# Patient Record
Sex: Female | Born: 1960 | Race: White | Hispanic: No | Marital: Married
Health system: Southern US, Community
[De-identification: ages and names within clinical notes are randomized; demographics above are authoritative.]

## PROBLEM LIST (undated history)

## (undated) DIAGNOSIS — J45909 Unspecified asthma, uncomplicated: Secondary | ICD-10-CM

---

## 2016-05-21 ENCOUNTER — Emergency Department (HOSPITAL_COMMUNITY): Payer: BLUE CROSS/BLUE SHIELD

## 2016-05-21 ENCOUNTER — Inpatient Hospital Stay (HOSPITAL_COMMUNITY)
Admission: EM | Admit: 2016-05-21 | Discharge: 2016-06-12 | DRG: 871 | Disposition: E | Payer: BLUE CROSS/BLUE SHIELD | Attending: Pulmonary Disease | Admitting: Pulmonary Disease

## 2016-05-21 ENCOUNTER — Inpatient Hospital Stay (HOSPITAL_COMMUNITY): Payer: BLUE CROSS/BLUE SHIELD

## 2016-05-21 ENCOUNTER — Encounter (HOSPITAL_COMMUNITY): Payer: Self-pay | Admitting: *Deleted

## 2016-05-21 DIAGNOSIS — R680 Hypothermia, not associated with low environmental temperature: Secondary | ICD-10-CM | POA: Diagnosis present

## 2016-05-21 DIAGNOSIS — F17210 Nicotine dependence, cigarettes, uncomplicated: Secondary | ICD-10-CM | POA: Diagnosis present

## 2016-05-21 DIAGNOSIS — R402332 Coma scale, best motor response, abnormal, at arrival to emergency department: Secondary | ICD-10-CM | POA: Diagnosis present

## 2016-05-21 DIAGNOSIS — R739 Hyperglycemia, unspecified: Secondary | ICD-10-CM | POA: Diagnosis present

## 2016-05-21 DIAGNOSIS — E8770 Fluid overload, unspecified: Secondary | ICD-10-CM | POA: Diagnosis present

## 2016-05-21 DIAGNOSIS — R402112 Coma scale, eyes open, never, at arrival to emergency department: Secondary | ICD-10-CM | POA: Diagnosis present

## 2016-05-21 DIAGNOSIS — Z01818 Encounter for other preprocedural examination: Secondary | ICD-10-CM

## 2016-05-21 DIAGNOSIS — G253 Myoclonus: Secondary | ICD-10-CM | POA: Diagnosis present

## 2016-05-21 DIAGNOSIS — G931 Anoxic brain damage, not elsewhere classified: Secondary | ICD-10-CM

## 2016-05-21 DIAGNOSIS — N179 Acute kidney failure, unspecified: Secondary | ICD-10-CM | POA: Diagnosis present

## 2016-05-21 DIAGNOSIS — E872 Acidosis, unspecified: Secondary | ICD-10-CM

## 2016-05-21 DIAGNOSIS — J9601 Acute respiratory failure with hypoxia: Secondary | ICD-10-CM | POA: Diagnosis present

## 2016-05-21 DIAGNOSIS — I248 Other forms of acute ischemic heart disease: Secondary | ICD-10-CM | POA: Diagnosis present

## 2016-05-21 DIAGNOSIS — Z9911 Dependence on respirator [ventilator] status: Secondary | ICD-10-CM

## 2016-05-21 DIAGNOSIS — I4901 Ventricular fibrillation: Secondary | ICD-10-CM | POA: Diagnosis present

## 2016-05-21 DIAGNOSIS — R6521 Severe sepsis with septic shock: Secondary | ICD-10-CM | POA: Diagnosis present

## 2016-05-21 DIAGNOSIS — J45909 Unspecified asthma, uncomplicated: Secondary | ICD-10-CM | POA: Diagnosis present

## 2016-05-21 DIAGNOSIS — J9600 Acute respiratory failure, unspecified whether with hypoxia or hypercapnia: Secondary | ICD-10-CM

## 2016-05-21 DIAGNOSIS — Z6827 Body mass index (BMI) 27.0-27.9, adult: Secondary | ICD-10-CM

## 2016-05-21 DIAGNOSIS — R402212 Coma scale, best verbal response, none, at arrival to emergency department: Secondary | ICD-10-CM | POA: Diagnosis present

## 2016-05-21 DIAGNOSIS — A419 Sepsis, unspecified organism: Principal | ICD-10-CM | POA: Diagnosis present

## 2016-05-21 DIAGNOSIS — I469 Cardiac arrest, cause unspecified: Secondary | ICD-10-CM | POA: Diagnosis present

## 2016-05-21 DIAGNOSIS — G9341 Metabolic encephalopathy: Secondary | ICD-10-CM | POA: Diagnosis present

## 2016-05-21 DIAGNOSIS — J189 Pneumonia, unspecified organism: Secondary | ICD-10-CM | POA: Diagnosis not present

## 2016-05-21 DIAGNOSIS — E669 Obesity, unspecified: Secondary | ICD-10-CM | POA: Diagnosis present

## 2016-05-21 DIAGNOSIS — Z66 Do not resuscitate: Secondary | ICD-10-CM

## 2016-05-21 DIAGNOSIS — J9602 Acute respiratory failure with hypercapnia: Secondary | ICD-10-CM | POA: Diagnosis not present

## 2016-05-21 DIAGNOSIS — Z452 Encounter for adjustment and management of vascular access device: Secondary | ICD-10-CM

## 2016-05-21 DIAGNOSIS — J690 Pneumonitis due to inhalation of food and vomit: Secondary | ICD-10-CM | POA: Diagnosis present

## 2016-05-21 HISTORY — DX: Unspecified asthma, uncomplicated: J45.909

## 2016-05-21 LAB — I-STAT CHEM 8, ED
BUN: 20 mg/dL (ref 6–20)
CHLORIDE: 106 mmol/L (ref 101–111)
Calcium, Ion: 1.08 mmol/L — ABNORMAL LOW (ref 1.15–1.40)
Creatinine, Ser: 1.1 mg/dL — ABNORMAL HIGH (ref 0.44–1.00)
GLUCOSE: 374 mg/dL — AB (ref 65–99)
HEMATOCRIT: 50 % — AB (ref 36.0–46.0)
HEMOGLOBIN: 17 g/dL — AB (ref 12.0–15.0)
POTASSIUM: 4.4 mmol/L (ref 3.5–5.1)
SODIUM: 137 mmol/L (ref 135–145)
TCO2: 17 mmol/L (ref 0–100)

## 2016-05-21 LAB — MAGNESIUM: Magnesium: 1.6 mg/dL — ABNORMAL LOW (ref 1.7–2.4)

## 2016-05-21 LAB — URINALYSIS, ROUTINE W REFLEX MICROSCOPIC
BILIRUBIN URINE: NEGATIVE
Ketones, ur: NEGATIVE mg/dL
Leukocytes, UA: NEGATIVE
NITRITE: NEGATIVE
Protein, ur: >=300 mg/dL — AB
SPECIFIC GRAVITY, URINE: 1.009 (ref 1.005–1.030)
pH: 6 (ref 5.0–8.0)

## 2016-05-21 LAB — BLOOD GAS, ARTERIAL
ACID-BASE DEFICIT: 7.7 mmol/L — AB (ref 0.0–2.0)
Acid-base deficit: 6.9 mmol/L — ABNORMAL HIGH (ref 0.0–2.0)
Bicarbonate: 16.2 mmol/L — ABNORMAL LOW (ref 20.0–28.0)
Bicarbonate: 17.3 mmol/L — ABNORMAL LOW (ref 20.0–28.0)
FIO2: 100
FIO2: 100
O2 SAT: 87.9 %
O2 Saturation: 88.4 %
PEEP/CPAP: 10 cmH2O
PEEP: 10 cmH2O
Patient temperature: 34
RATE: 20 resp/min
RATE: 30 resp/min
VT: 450 mL
VT: 450 mL
pCO2 arterial: 70.8 mmHg (ref 32.0–48.0)
pCO2 arterial: 62.5 mmHg — ABNORMAL HIGH (ref 32.0–48.0)
pH, Arterial: 7.093 — CL (ref 7.350–7.450)
pH, Arterial: 7.146 — CL (ref 7.350–7.450)
pO2, Arterial: 77.1 mmHg — ABNORMAL LOW (ref 83.0–108.0)
pO2, Arterial: 72.5 mmHg — ABNORMAL LOW (ref 83.0–108.0)

## 2016-05-21 LAB — COMPREHENSIVE METABOLIC PANEL
ALBUMIN: 2.9 g/dL — AB (ref 3.5–5.0)
ALT: 85 U/L — AB (ref 14–54)
ALT: 123 U/L — ABNORMAL HIGH (ref 14–54)
ANION GAP: 13 (ref 5–15)
AST: 184 U/L — AB (ref 15–41)
AST: 142 U/L — ABNORMAL HIGH (ref 15–41)
Albumin: 3.5 g/dL (ref 3.5–5.0)
Alkaline Phosphatase: 126 U/L (ref 38–126)
Alkaline Phosphatase: 75 U/L (ref 38–126)
Anion gap: 18 — ABNORMAL HIGH (ref 5–15)
BILIRUBIN TOTAL: 1 mg/dL (ref 0.3–1.2)
BUN: 18 mg/dL (ref 6–20)
BUN: 16 mg/dL (ref 6–20)
CHLORIDE: 103 mmol/L (ref 101–111)
CO2: 17 mmol/L — AB (ref 22–32)
CO2: 21 mmol/L — AB (ref 22–32)
CREATININE: 1.38 mg/dL — AB (ref 0.44–1.00)
Calcium: 8.7 mg/dL — ABNORMAL LOW (ref 8.9–10.3)
Calcium: 7.1 mg/dL — ABNORMAL LOW (ref 8.9–10.3)
Chloride: 105 mmol/L (ref 101–111)
Creatinine, Ser: 1.02 mg/dL — ABNORMAL HIGH (ref 0.44–1.00)
GFR calc non Af Amer: 41 mL/min — ABNORMAL LOW (ref >60)
GFR calc non Af Amer: 59 mL/min — ABNORMAL LOW (ref >60)
GFR, EST AFRICAN AMERICAN: 48 mL/min — AB (ref >60)
GLUCOSE: 199 mg/dL — AB (ref 65–99)
Glucose, Bld: 373 mg/dL — ABNORMAL HIGH (ref 65–99)
POTASSIUM: 2.9 mmol/L — AB (ref 3.5–5.1)
Potassium: 4.5 mmol/L (ref 3.5–5.1)
SODIUM: 138 mmol/L (ref 135–145)
SODIUM: 139 mmol/L (ref 135–145)
TOTAL PROTEIN: 7.4 g/dL (ref 6.5–8.1)
TOTAL PROTEIN: 5.5 g/dL — AB (ref 6.5–8.1)
Total Bilirubin: 0.6 mg/dL (ref 0.3–1.2)

## 2016-05-21 LAB — CBC WITH DIFFERENTIAL/PLATELET
BASOS PCT: 1 %
Band Neutrophils: 3 %
Basophils Absolute: 0.6 10*3/uL — ABNORMAL HIGH (ref 0.0–0.1)
EOS PCT: 3 %
Eosinophils Absolute: 1.7 10*3/uL — ABNORMAL HIGH (ref 0.0–0.7)
HEMATOCRIT: 47.3 % — AB (ref 36.0–46.0)
HEMOGLOBIN: 14.9 g/dL (ref 12.0–15.0)
Lymphocytes Relative: 15 %
Lymphs Abs: 8.6 10*3/uL — ABNORMAL HIGH (ref 0.7–4.0)
MCH: 29.4 pg (ref 26.0–34.0)
MCHC: 31.5 g/dL (ref 30.0–36.0)
MCV: 93.5 fL (ref 78.0–100.0)
METAMYELOCYTES PCT: 7 %
MONO ABS: 1.1 10*3/uL — AB (ref 0.1–1.0)
MYELOCYTES: 5 %
Monocytes Relative: 2 %
NEUTROS PCT: 64 %
Neutro Abs: 45.2 10*3/uL — ABNORMAL HIGH (ref 1.7–7.7)
Platelets: 403 10*3/uL — ABNORMAL HIGH (ref 150–400)
RBC: 5.06 MIL/uL (ref 3.87–5.11)
RDW: 14.3 % (ref 11.5–15.5)
WBC: 57.2 10*3/uL (ref 4.0–10.5)

## 2016-05-21 LAB — RESPIRATORY PANEL BY PCR
ADENOVIRUS-RVPPCR: NOT DETECTED
BORDETELLA PERTUSSIS-RVPCR: NOT DETECTED
CHLAMYDOPHILA PNEUMONIAE-RVPPCR: NOT DETECTED
CORONAVIRUS HKU1-RVPPCR: NOT DETECTED
CORONAVIRUS NL63-RVPPCR: NOT DETECTED
Coronavirus 229E: NOT DETECTED
Coronavirus OC43: NOT DETECTED
Influenza A: NOT DETECTED
Influenza B: NOT DETECTED
MYCOPLASMA PNEUMONIAE-RVPPCR: NOT DETECTED
Metapneumovirus: NOT DETECTED
PARAINFLUENZA VIRUS 4-RVPPCR: NOT DETECTED
Parainfluenza Virus 1: NOT DETECTED
Parainfluenza Virus 2: NOT DETECTED
Parainfluenza Virus 3: NOT DETECTED
RESPIRATORY SYNCYTIAL VIRUS-RVPPCR: NOT DETECTED
RHINOVIRUS / ENTEROVIRUS - RVPPCR: NOT DETECTED

## 2016-05-21 LAB — POCT I-STAT 3, ART BLOOD GAS (G3+)
ACID-BASE DEFICIT: 8 mmol/L — AB (ref 0.0–2.0)
Bicarbonate: 20.5 mmol/L (ref 20.0–28.0)
O2 SAT: 95 %
PCO2 ART: 50 mmHg — AB (ref 32.0–48.0)
PH ART: 7.211 — AB (ref 7.350–7.450)
Patient temperature: 35.2
TCO2: 22 mmol/L (ref 0–100)
pO2, Arterial: 83 mmHg (ref 83.0–108.0)

## 2016-05-21 LAB — PROCALCITONIN: Procalcitonin: 6.71 ng/mL

## 2016-05-21 LAB — GLUCOSE, CAPILLARY
GLUCOSE-CAPILLARY: 214 mg/dL — AB (ref 65–99)
GLUCOSE-CAPILLARY: 151 mg/dL — AB (ref 65–99)
GLUCOSE-CAPILLARY: 152 mg/dL — AB (ref 65–99)
Glucose-Capillary: 172 mg/dL — ABNORMAL HIGH (ref 65–99)

## 2016-05-21 LAB — TYPE AND SCREEN
ABO/RH(D): O POS
ABO/RH(D): O POS
ANTIBODY SCREEN: NEGATIVE
Antibody Screen: NEGATIVE

## 2016-05-21 LAB — MRSA PCR SCREENING: MRSA BY PCR: NEGATIVE

## 2016-05-21 LAB — I-STAT CG4 LACTIC ACID, ED: LACTIC ACID, VENOUS: 11.01 mmol/L — AB (ref 0.5–1.9)

## 2016-05-21 LAB — TROPONIN I
TROPONIN I: 0.05 ng/mL — AB (ref <0.03)
TROPONIN I: 0.05 ng/mL — AB (ref <0.03)
Troponin I: 0.05 ng/mL (ref <0.03)

## 2016-05-21 LAB — PROTIME-INR
INR: 1.2
Prothrombin Time: 15.3 seconds — ABNORMAL HIGH (ref 11.4–15.2)

## 2016-05-21 LAB — I-STAT TROPONIN, ED: Troponin i, poc: 0.03 ng/mL (ref 0.00–0.08)

## 2016-05-21 LAB — BRAIN NATRIURETIC PEPTIDE: B Natriuretic Peptide: 370 pg/mL — ABNORMAL HIGH (ref 0.0–100.0)

## 2016-05-21 LAB — LACTIC ACID, PLASMA
LACTIC ACID, VENOUS: 4 mmol/L — AB (ref 0.5–1.9)
Lactic Acid, Venous: 4 mmol/L (ref 0.5–1.9)

## 2016-05-21 LAB — LIPID PANEL
CHOLESTEROL: 131 mg/dL (ref 0–200)
HDL: 40 mg/dL — ABNORMAL LOW (ref >40)
LDL Cholesterol: 71 mg/dL (ref 0–99)
TRIGLYCERIDES: 101 mg/dL (ref <150)
Total CHOL/HDL Ratio: 3.3 RATIO
VLDL: 20 mg/dL (ref 0–40)

## 2016-05-21 LAB — STREP PNEUMONIAE URINARY ANTIGEN: Strep Pneumo Urinary Antigen: NEGATIVE

## 2016-05-21 LAB — ABO/RH: ABO/RH(D): O POS

## 2016-05-21 LAB — ETHANOL

## 2016-05-21 LAB — TRIGLYCERIDES: Triglycerides: 96 mg/dL (ref <150)

## 2016-05-21 LAB — PHOSPHORUS: PHOSPHORUS: 1.9 mg/dL — AB (ref 2.5–4.6)

## 2016-05-21 MED ORDER — ROCURONIUM BROMIDE 50 MG/5ML IV SOLN
90.0000 mg | Freq: Once | INTRAVENOUS | Status: AC
  Administered 2016-05-21: 90 mg via INTRAVENOUS

## 2016-05-21 MED ORDER — ALBUTEROL SULFATE (2.5 MG/3ML) 0.083% IN NEBU
INHALATION_SOLUTION | RESPIRATORY_TRACT | Status: AC
Start: 2016-05-21 — End: 2016-05-21
  Administered 2016-05-21: 5 mg
  Filled 2016-05-21: qty 6

## 2016-05-21 MED ORDER — SODIUM CHLORIDE 0.9 % IV BOLUS (SEPSIS)
1000.0000 mL | Freq: Once | INTRAVENOUS | Status: AC
  Administered 2016-05-21: 1000 mL via INTRAVENOUS

## 2016-05-21 MED ORDER — IOPAMIDOL (ISOVUE-370) INJECTION 76%
80.0000 mL | Freq: Once | INTRAVENOUS | Status: AC | PRN
  Administered 2016-05-21: 80 mL via INTRAVENOUS

## 2016-05-21 MED ORDER — LEVETIRACETAM 500 MG/5ML IV SOLN
500.0000 mg | Freq: Two times a day (BID) | INTRAVENOUS | Status: DC
  Administered 2016-05-21 – 2016-05-22 (×2): 500 mg via INTRAVENOUS
  Filled 2016-05-21 (×4): qty 5

## 2016-05-21 MED ORDER — ORAL CARE MOUTH RINSE
15.0000 mL | OROMUCOSAL | Status: DC
  Administered 2016-05-21 – 2016-05-22 (×5): 15 mL via OROMUCOSAL

## 2016-05-21 MED ORDER — ETOMIDATE 2 MG/ML IV SOLN
20.0000 mg | Freq: Once | INTRAVENOUS | Status: AC
  Administered 2016-05-21: 20 mg via INTRAVENOUS

## 2016-05-21 MED ORDER — DEXTROSE 5 % IV SOLN
0.0000 ug/min | INTRAVENOUS | Status: DC
  Administered 2016-05-21 (×2): 20 ug/min via INTRAVENOUS
  Administered 2016-05-21: 10 ug/min via INTRAVENOUS
  Administered 2016-05-22: 18 ug/min via INTRAVENOUS
  Filled 2016-05-21 (×4): qty 4

## 2016-05-21 MED ORDER — VANCOMYCIN HCL 10 G IV SOLR
1500.0000 mg | Freq: Once | INTRAVENOUS | Status: AC
  Administered 2016-05-21: 1500 mg via INTRAVENOUS
  Filled 2016-05-21: qty 1500

## 2016-05-21 MED ORDER — POTASSIUM PHOSPHATES 15 MMOLE/5ML IV SOLN
20.0000 mmol | Freq: Once | INTRAVENOUS | Status: AC
  Administered 2016-05-21: 20 mmol via INTRAVENOUS
  Filled 2016-05-21: qty 6.67

## 2016-05-21 MED ORDER — ACETAMINOPHEN 160 MG/5ML PO SOLN
650.0000 mg | Freq: Four times a day (QID) | ORAL | Status: DC | PRN
  Administered 2016-05-22 (×2): 650 mg via ORAL
  Filled 2016-05-21 (×2): qty 20.3

## 2016-05-21 MED ORDER — HEPARIN SODIUM (PORCINE) 5000 UNIT/ML IJ SOLN
5000.0000 [IU] | Freq: Three times a day (TID) | INTRAMUSCULAR | Status: DC
  Administered 2016-05-21 (×2): 5000 [IU] via SUBCUTANEOUS
  Filled 2016-05-21 (×2): qty 1

## 2016-05-21 MED ORDER — POTASSIUM CHLORIDE 10 MEQ/50ML IV SOLN
10.0000 meq | INTRAVENOUS | Status: AC
  Administered 2016-05-21 – 2016-05-22 (×4): 10 meq via INTRAVENOUS
  Filled 2016-05-21 (×4): qty 50

## 2016-05-21 MED ORDER — SODIUM CHLORIDE 0.9% FLUSH
10.0000 mL | Freq: Two times a day (BID) | INTRAVENOUS | Status: DC
  Administered 2016-05-21 (×2): 10 mL

## 2016-05-21 MED ORDER — FENTANYL CITRATE (PF) 100 MCG/2ML IJ SOLN
25.0000 ug | INTRAMUSCULAR | Status: DC | PRN
  Administered 2016-05-21: 100 ug via INTRAVENOUS
  Administered 2016-05-21: 50 ug via INTRAVENOUS
  Filled 2016-05-21 (×2): qty 2

## 2016-05-21 MED ORDER — SODIUM CHLORIDE 0.9% FLUSH
10.0000 mL | Freq: Two times a day (BID) | INTRAVENOUS | Status: DC
  Administered 2016-05-21: 10 mL

## 2016-05-21 MED ORDER — PIPERACILLIN-TAZOBACTAM 3.375 G IVPB
3.3750 g | Freq: Three times a day (TID) | INTRAVENOUS | Status: DC
  Administered 2016-05-21 – 2016-05-22 (×2): 3.375 g via INTRAVENOUS
  Filled 2016-05-21 (×6): qty 50

## 2016-05-21 MED ORDER — PANTOPRAZOLE SODIUM 40 MG IV SOLR
40.0000 mg | Freq: Every day | INTRAVENOUS | Status: DC
  Administered 2016-05-21: 40 mg via INTRAVENOUS
  Filled 2016-05-21: qty 40

## 2016-05-21 MED ORDER — ACETAMINOPHEN 160 MG/5ML PO SOLN: 325.0000 mg | Freq: Four times a day (QID) | ORAL | Status: DC | PRN

## 2016-05-21 MED ORDER — IPRATROPIUM-ALBUTEROL 0.5-2.5 (3) MG/3ML IN SOLN
3.0000 mL | Freq: Four times a day (QID) | RESPIRATORY_TRACT | Status: DC
  Administered 2016-05-21 – 2016-05-22 (×3): 3 mL via RESPIRATORY_TRACT
  Filled 2016-05-21 (×3): qty 3

## 2016-05-21 MED ORDER — MIDAZOLAM HCL 5 MG/ML IJ SOLN
0.0000 mg/h | INTRAMUSCULAR | Status: DC
  Administered 2016-05-21: 2 mg/h via INTRAVENOUS
  Filled 2016-05-21: qty 10

## 2016-05-21 MED ORDER — SODIUM CHLORIDE 0.9% FLUSH: 10.0000 mL | INTRAVENOUS | Status: DC | PRN

## 2016-05-21 MED ORDER — STERILE WATER FOR INJECTION IV SOLN
INTRAVENOUS | Status: DC
  Administered 2016-05-21 – 2016-05-22 (×3): via INTRAVENOUS
  Filled 2016-05-21 (×7): qty 850

## 2016-05-21 MED ORDER — PROPOFOL 1000 MG/100ML IV EMUL
5.0000 ug/kg/min | INTRAVENOUS | Status: DC
  Administered 2016-05-21: 10 ug/kg/min via INTRAVENOUS
  Administered 2016-05-22: 25 ug/kg/min via INTRAVENOUS
  Filled 2016-05-21 (×2): qty 100

## 2016-05-21 MED ORDER — CHLORHEXIDINE GLUCONATE 0.12% ORAL RINSE (MEDLINE KIT)
15.0000 mL | Freq: Two times a day (BID) | OROMUCOSAL | Status: DC
  Administered 2016-05-21 (×2): 15 mL via OROMUCOSAL

## 2016-05-21 MED ORDER — SODIUM CHLORIDE 0.9 % IV SOLN: INTRAVENOUS | Status: DC | PRN

## 2016-05-21 MED ORDER — DEXTROSE 5 % IV SOLN
500.0000 mg | Freq: Once | INTRAVENOUS | Status: AC
  Administered 2016-05-21: 500 mg via INTRAVENOUS
  Filled 2016-05-21: qty 500

## 2016-05-21 MED ORDER — INSULIN ASPART 100 UNIT/ML ~~LOC~~ SOLN
2.0000 [IU] | SUBCUTANEOUS | Status: DC
  Administered 2016-05-21: 4 [IU] via SUBCUTANEOUS
  Administered 2016-05-21: 6 [IU] via SUBCUTANEOUS
  Administered 2016-05-21 – 2016-05-22 (×2): 4 [IU] via SUBCUTANEOUS

## 2016-05-21 MED ORDER — MAGNESIUM SULFATE 2 GM/50ML IV SOLN
2.0000 g | Freq: Once | INTRAVENOUS | Status: AC
  Administered 2016-05-21: 2 g via INTRAVENOUS
  Filled 2016-05-21: qty 50

## 2016-05-21 MED ORDER — MIDAZOLAM BOLUS VIA INFUSION
1.0000 mg | INTRAVENOUS | Status: DC | PRN
  Filled 2016-05-21: qty 2

## 2016-05-21 MED ORDER — CHLORHEXIDINE GLUCONATE CLOTH 2 % EX PADS: 6.0000 | MEDICATED_PAD | Freq: Every day | CUTANEOUS | Status: DC

## 2016-05-21 MED ORDER — MIDAZOLAM HCL 2 MG/2ML IJ SOLN
2.0000 mg | Freq: Once | INTRAMUSCULAR | Status: DC
  Filled 2016-05-21: qty 2

## 2016-05-21 MED ORDER — DEXTROSE 5 % IV SOLN
500.0000 mg | INTRAVENOUS | Status: DC
  Filled 2016-05-21: qty 500

## 2016-05-21 MED ORDER — FENTANYL CITRATE (PF) 100 MCG/2ML IJ SOLN: 25.0000 ug | INTRAMUSCULAR | Status: DC | PRN

## 2016-05-21 MED ORDER — SODIUM CHLORIDE 0.9 % IV BOLUS (SEPSIS)
500.0000 mL | Freq: Once | INTRAVENOUS | Status: AC
  Administered 2016-05-21: 500 mL via INTRAVENOUS

## 2016-05-21 MED ORDER — VECURONIUM BROMIDE 10 MG IV SOLR
INTRAVENOUS | Status: AC
  Filled 2016-05-21: qty 10

## 2016-05-21 MED ORDER — METHYLPREDNISOLONE SODIUM SUCC 125 MG IJ SOLR
60.0000 mg | Freq: Four times a day (QID) | INTRAMUSCULAR | Status: DC
  Administered 2016-05-21 (×3): 60 mg via INTRAVENOUS
  Filled 2016-05-21 (×3): qty 2

## 2016-05-21 MED ORDER — SODIUM CHLORIDE 0.9 % IV SOLN
2.0000 g | Freq: Once | INTRAVENOUS | Status: AC
  Administered 2016-05-21: 2 g via INTRAVENOUS
  Filled 2016-05-21: qty 20

## 2016-05-21 MED ORDER — VECURONIUM BROMIDE 10 MG IV SOLR
10.0000 mg | Freq: Once | INTRAVENOUS | Status: AC
  Administered 2016-05-21: 10 mg via INTRAVENOUS

## 2016-05-21 MED ORDER — SODIUM CHLORIDE 0.9 % IV SOLN: 250.0000 mL | INTRAVENOUS | Status: DC | PRN

## 2016-05-21 MED ORDER — DEXTROSE 5 % IV SOLN
1.0000 g | Freq: Once | INTRAVENOUS | Status: AC
  Administered 2016-05-21: 1 g via INTRAVENOUS
  Filled 2016-05-21: qty 10

## 2016-05-21 MED ORDER — CHLORHEXIDINE GLUCONATE CLOTH 2 % EX PADS
6.0000 | MEDICATED_PAD | Freq: Every day | CUTANEOUS | Status: DC
  Administered 2016-05-21: 6 via TOPICAL

## 2016-05-21 MED ORDER — VANCOMYCIN HCL IN DEXTROSE 1-5 GM/200ML-% IV SOLN
1000.0000 mg | Freq: Two times a day (BID) | INTRAVENOUS | Status: DC
  Administered 2016-05-21: 1000 mg via INTRAVENOUS
  Filled 2016-05-21 (×3): qty 200

## 2016-05-21 NOTE — Progress Notes (Signed)
eLink Physician-Brief Progress Note Patient Name: Olivia BruinsLaura Willis DOB: 10/24/1960 MRN: [id number]   Date of Service  2016-08-16  HPI/Events of Note  Multiple electrolyte abnormalities.   eICU Interventions  Replaced.      Intervention Category Major Interventions: Electrolyte abnormality - evaluation and management  Shane Crutchradeep Amarii Amy 2016-08-16, 9:29 PM

## 2016-05-21 NOTE — ED Triage Notes (Signed)
Pt brought in by [facility] for c/o cpr; pt was a witnessed cpr by husband; pt had got up to go to bathroom and husband stated pt started c/o chest pain; pt became unresponsive and husband called 911, instructions to give cpr were given to husband, husband did cpr for 12 minutes until ems arrived and ems performed cpr from 5am to 550; pt was given one round of epi with return of pulses; pt was intubated using king ET and was found to have spontaneous respirations;

## 2016-05-21 NOTE — ED Notes (Signed)
CRITICAL VALUE ALERT  Critical value received:  pH 7.093, pCO2 70.8, pO2 77.1, Bicarb 16.2, SO2 87.9  Date of notification:  2016/09/06  Time of notification:  0812  Critical value read back:Yes.    Nurse who received alert:  Fransico HimMeagan Leroy Pettway, RN  MD notified (1st page):  Dr. Particia NearingHaviland  Time of first page:  0820  MD notified (2nd page): Dr. Celene SkeenNester  Time of second page: 0822  Responding MD:  Dr. Celene SkeenNester  Time MD responded:  [phone]

## 2016-05-21 NOTE — ED Provider Notes (Signed)
Pt signed out by Dr. Criss AlvineGoldston awaiting [facility].  The pt's CT scans were reviewed.  No PE.  Pt's ABG d/w Dr. Marlyne BeardsJennings who recommended bicarb drip.  This was started.  Care Link now here.  Pt is as stable as possible for transfer.  Husband updated at bedside.   Jacalyn LefevreHaviland, Deniyah Dillavou, MD 06/22/2016 [phone]

## 2016-05-21 NOTE — H&P (Signed)
PULMONARY / CRITICAL CARE MEDICINE   Name: Kristin BruinsLaura Tregoning MRN: [id number] DOB: 01/07/1961    ADMISSION DATE:  05/13/2016 CONSULTATION DATE:  05/17/2016  REFERRING MD:  Dr. Lorin PicketScott, EDP [facility]  CHIEF COMPLAINT:  Cardiac Arrest   HISTORY OF PRESENT ILLNESS:   56 year old smoker (husband reports recently patient attempting to cut back) female with PMH of Asthma. Presents to ED on 5/10 from home after witnessed cardiac arrest. Husband reports for the last week patient has been complaining of dyspnea and cough. She was seen recently at urgent care in which she was prescribed prednisone, albuterol, and possible antibiotic (family unsure).   5/10 patient was walking to bathroom when she began complaining of severe dyspnea and chest pain. Patient became unresponsive and husband called 911 and began CPR. EMS arrived 12 minutes later, and Coded patient for an additional 50 minutes for a total down time of 62 minutes. Upon arrival to ED patient was hypotensive (systolic 60-70) with lactic acid of 11.01 and WBC 57.2. Code Sepsis initiated. PE and Heat CT negative for acute process. CXR revealed diffuse disease and interstitial opacity greater to the right concerning for PNA vs Aspiration. ABG 7.093/70.8/77.1. [facility] with PCCM to admit.    PAST MEDICAL HISTORY :  She  has a past medical history of Asthma.  PAST SURGICAL HISTORY: She  has no past surgical history on file.  No Known Allergies  No current facility-administered medications on file prior to encounter.    No current outpatient prescriptions on file prior to encounter.    FAMILY HISTORY:  Her has no family status information on file.    SOCIAL HISTORY: She    REVIEW OF SYSTEMS:   Unable to review as patient is intubated and unresponsive   SUBJECTIVE:  Presented from [facility] on ventilator  VITAL SIGNS: BP 105/76   Pulse 94   Temp (!) 94.5 F (34.7 C)   Resp (!) 30   Ht 5\' 8"  (1.727 m)    Wt 81.6 kg (180 lb)   SpO2 96%   BMI 27.37 kg/m   HEMODYNAMICS:    VENTILATOR SETTINGS: Vent Mode: PRVC FiO2 (%):  [100 %] 100 % Set Rate:  [20 bmp-30 bmp] 30 bmp Vt Set:  [450 mL] 450 mL PEEP:  [10 cmH20] 10 cmH20 Plateau Pressure:  [30 cmH20] 30 cmH20  INTAKE / OUTPUT: No intake/output data recorded.  PHYSICAL EXAMINATION: General:  Adult female, no distress  Neuro:  Myoclonic seizures noted, upward gaze, pupils intact, no response to pain, -gag, -cough   HEENT:  ETT in place  Cardiovascular:  Tachy, no MRG, NI S1/S2 Lungs:  Distant/diminished breath sounds, no wheeze  Abdomen:  Obese, active bowel sounds Musculoskeletal:  No acute  Skin:  Cool, dry, intact   LABS:  BMET  Recent Labs Lab 05/17/2016 0645 06/01/2016 0654  NA 138 137  K 4.5 4.4  CL 103 106  CO2 17*  --   BUN 18 20  CREATININE 1.38* 1.10*  GLUCOSE 373* 374*    Electrolytes  Recent Labs Lab 05/23/2016 0645  CALCIUM 8.7*    CBC  Recent Labs Lab 05/12/2016 0645 05/26/2016 0654  WBC 57.2*  --   HGB 14.9 17.0*  HCT 47.3* 50.0*  PLT 403*  --     Coag's  Recent Labs Lab 05/23/2016 0645  INR 1.20    Sepsis Markers  Recent Labs Lab 06/07/2016 0649  LATICACIDVEN 11.01*    ABG  Recent Labs Lab 05/24/2016 0800 05-24-2016 0915  PHART 7.093* 7.146*  PCO2ART 70.8* 62.5*  PO2ART 77.1* 72.5*    Liver Enzymes  Recent Labs Lab May 24, 2016 0645  AST 184*  ALT 85*  ALKPHOS 126  BILITOT 0.6  ALBUMIN 3.5    Cardiac Enzymes  Recent Labs Lab May 24, 2016 0645  TROPONINI 0.05*    Glucose No results for input(s): GLUCAP in the last 168 hours.  Imaging Ct Head Wo Contrast  Result Date: 05/24/2016 CLINICAL DATA:  56 y/o female with acute chest pain and then became unresponsive. Cardiac arrest. Status post CPR. EXAM: CT HEAD WITHOUT CONTRAST TECHNIQUE: Contiguous axial images were obtained from the base of the skull through the vertex without intravenous contrast. COMPARISON:  None.  FINDINGS: Brain: No midline shift, ventriculomegaly, mass effect, evidence of mass lesion, intracranial hemorrhage or evidence of cortically based acute infarction. Gray-white matter differentiation is within normal limits throughout the brain. Small dystrophic calcifications in the caudate nuclei. No cerebral edema is evident. No cortical encephalomalacia identified. Vascular: No suspicious intracranial vascular hyperdensity. Skull: Intact.  No acute osseous abnormality identified. Sinuses/Orbits: Clear. Retained secretions in the nasal cavity and nasopharynx. Endotracheal and oral enteric tubes visible on the scout view. Other: Visualized orbits and scalp soft tissues are within normal limits. IMPRESSION: No acute intracranial abnormality is evident by CT. Electronically Signed   By: Odessa Fleming M.D.   On: 2016/05/24 08:53   Ct Angio Chest Pe W Or Wo Contrast  Result Date: 2016-05-24 CLINICAL DATA:  56 y/o female with acute chest pain and then became unresponsive. Cardiac arrest. Status post CPR. EXAM: CT ANGIOGRAPHY CHEST WITH CONTRAST TECHNIQUE: Multidetector CT imaging of the chest was performed using the standard protocol during bolus administration of intravenous contrast. Multiplanar CT image reconstructions and MIPs were obtained to evaluate the vascular anatomy. CONTRAST:  80 mL Isovue 370 COMPARISON:  Portable chest 0700 hours today. Chest radiographs 05/15/2016 FINDINGS: Cardiovascular: Good contrast bolus timing in the pulmonary arterial tree. No focal filling defect identified in the pulmonary arteries to suggest acute pulmonary embolism. Borderline to mild cardiomegaly. No pericardial effusion. Grossly negative visible aorta. No calcified coronary artery atherosclerosis is evident. Mediastinum/Nodes: Enteric tube in place, courses to the mid stomach. No mediastinal mass or lymphadenopathy. Lungs/Pleura: Endotracheal tube tip located between the clavicles and carina. Major airways are patent, but there  are widespread areas of dependent consolidation in the posterior upper lobes and right greater than left lower lobes. Superimposed mosaic attenuation elsewhere in the lungs. Mild upper lobe pulmonary septal thickening. Trace superimposed bilateral pleural effusions. Upper Abdomen: Enteric tube appears to terminated the mid stomach. Negative visible liver, spleen, pancreas, adrenal glands and left kidney. Musculoskeletal: Sternum intact. Nondisplaced anterolateral left fifth and seventh rib fractures. No other No acute osseous abnormality identified. Review of the MIP images confirms the above findings. IMPRESSION: 1. No evidence of acute pulmonary embolus. Negative visible aorta. Borderline to mild cardiomegaly without pleural effusion. 2. Fairly large areas of dependent bilateral pulmonary consolidation, most likely aspiration in this clinical setting. Superimposed small bilateral pleural effusions. Superimposed bilateral mosaic attenuation and apical septal thickening may reflect mild pulmonary edema. 3. Nondisplaced left fifth and seventh rib fractures. Electronically Signed   By: Odessa Fleming M.D.   On: May 24, 2016 09:01   Dg Chest Portable 1 View  Result Date: 05/24/2016 CLINICAL DATA:  CPR. EXAM: PORTABLE CHEST 1 VIEW COMPARISON:  05/15/2016 FINDINGS: Endotracheal tube tip is at the clavicular heads. An orogastric tube reaches the  stomach. Normal heart size. Diffuse airspace and interstitial opacity with the appearance of pulmonary edema. No visible effusion or pneumothorax. No detected rib fracture. Limited exam with overlapping hardware and underpenetration. IMPRESSION: 1. Unremarkable positioning of endotracheal and orogastric tubes. 2. Pulmonary edema. Opacification is asymmetric to the right and superimposed pneumonia or aspiration is not excluded. Electronically Signed   By: Marnee Spring M.D.   On: 05/21/2016 07:21     STUDIES:  CT Head 5/10 > No acute CTA PE 5/10 > No PE, Borderline mild  cardiomegaly without pleural effusion, large areas of dependent bilateral pulmonary consolidation, most likely aspiration, superimposed small bilateral pleural effusions, superimposed bilateral mosaic attenuation and apical septal thickening, nondisplaced left fifth and seventh rib fractures   CULTURES: Blood 5/10 >> Urine 5/10 >> Sputum 5/10 >> Step Pnemo 5/10 > Negative   ANTIBIOTICS: Cefepime 5/10 one dose in ED  Vancomycin 5/10 >> Zosyn 5/10 >> Azithromycin 5/10 >>  SIGNIFICANT EVENTS: 5/10 > Presents to ED > Cardiac Arrest   LINES/TUBES: ETT 5/10 >> RIJ 5/10 >>  DISCUSSION: 56 year old smoker with PMH of asthma presents to ED after cardiac arrest at home. Total downtime 62 minutes. CXR with CAP vs Aspiration.   ASSESSMENT / PLAN:  PULMONARY A: Acute Hypoxic/Hypercarbia Respiratory Failure +/-Aspiration, +/-CAP, +/-Volume Overload, +/-Viral Infection  Respiratory Acidosis  H/O Asthma  P:   Vent Support No wean in setting of Encephalopathy/AMS  Trend CXR/ABG  VAP Bundle  Solu-Medrol 60mg  q6h  CARDIOVASCULAR A:  Cardiac Arrest  -Total down time 62 minutes with PEA/Asystole with hypothermia on arrival > decision made note to activate code cool Cardiogenic vs Septic Shock  Elevated Trop > Demand Ischemia ? P:  Cardiac Monitoring  Trend Trop Levophed to maintain MAP >65  Place Aline/CVC  Maintain TEMP <99   RENAL A:   Lactic Acidosis  S/P 4L NS Acute Kidney Injury  P:   Trend Lactic Acid  Trend BMP Replace Electrolytes as needed  Bicarb 150 meq @ 150 ml/hr   GASTROINTESTINAL A:   Nutrition  Obesity P:   NPO PPI  HEMATOLOGIC A:   DVT prophylaxis    P:  Trend CBC Smear pending  Heparin SQ   INFECTIOUS A:   Septic Shock in setting of CAP +/- Viral infection vs Aspiration  Leukocytosis (WBC 57.2 on arrival) P:   Follow Culture Data Trend WBC and Fever Curve  Trend Procal and Lactic Acid  RVP Pending  Vancomycin/Zosyn/Azithromycin    ENDOCRINE A:   Hyperglycemia    P:   Trend Glucose  SSI HA1C in AM   NEUROLOGIC A:   Acute Encephalopathy metabolic vs Anoxic  Myoclonic Seizure  P:   RASS goal: 0/-1 EEG pending  Keppra 500 mg BID  Fentanyl PRN    FAMILY  - Updates: No family at bedside   - Inter-disciplinary family meet or Palliative Care meeting due by:  05/28/2016   CC Time: 62 minutes   Jovita Kussmaul, AGAC-NP [facility]  Pgr: [phone]  PCCM Pgr: [phone]

## 2016-05-21 NOTE — ED Notes (Signed)
CRITICAL VALUE ALERT  Critical value received: Troponin 0.05  Date of notification:  05/26/2016  Time of notification:  0722  Critical value read back: yes  Nurse who received alert: Tsosie Billingobin Hisae Decoursey RN  MD notified (1st page):  Dr Criss AlvineGoldston

## 2016-05-21 NOTE — Procedures (Signed)
Arterial Catheter Insertion Procedure Note Kristin BruinsLaura Hollick [id number] 10/16/1960  Procedure: Insertion of Arterial Catheter  Indications: Blood pressure monitoring  Procedure Details Consent: Risks of procedure as well as the alternatives and risks of each were explained to the (patient/caregiver).  Consent for procedure obtained. Time Out: Verified patient identification, verified procedure, site/side was marked, verified correct patient position, special equipment/implants available, medications/allergies/relevent history reviewed, required imaging and test results available.  Performed  Maximum sterile technique was used including antiseptics. Skin prep: Chlorhexidine; local anesthetic administered 20 gauge catheter was inserted into right radial artery using the Seldinger technique.  Evaluation Blood flow good; BP tracing good. Complications: No apparent complications.   Ronny FlurryMorgan B Callyn Severtson January 06, 2017

## 2016-05-21 NOTE — ED Notes (Addendum)
Date and time results received: 06/11/2016 9:26 AM  (use smartphrase ".now" to insert current time)  Test: ABG Critical Value:  PH     7.146 CO2   62.5 Po2    72.5   Name of Provider Notified: Jamison NeighborNestor  Orders Received? Or Actions Taken?: Actions Taken: Verbal to increase respirations to 32 and [facility]

## 2016-05-21 NOTE — ED Notes (Signed)
Date and time results received: May 13, 2016 0706 (use smartphrase ".now" to insert current time)  Test: Lactic Critical Value: 11.0 Name of Provider Notified: Dr Rayford HalstedGolston  Orders Received? Or Actions Taken?:  No actions taken

## 2016-05-21 NOTE — Procedures (Signed)
Central Venous Catheter Insertion Procedure Note Olivia BruinsLaura Willis [id number] 04/02/1960  Procedure: Insertion of Central Venous Catheter Indications: Assessment of intravascular volume, Drug and/or fluid administration and Frequent blood sampling  Procedure Details Consent: Unable to obtain consent because of emergent medical necessity. Time Out: Verified patient identification, verified procedure, site/side was marked, verified correct patient position, special equipment/implants available, medications/allergies/relevent history reviewed, required imaging and test results available.  Performed  Maximum sterile technique was used including antiseptics, cap, gloves, gown, hand hygiene, mask and sheet. Skin prep: Chlorhexidine; local anesthetic administered A antimicrobial bonded/coated triple lumen catheter was placed in the right internal jugular vein using the Seldinger technique.  Evaluation Blood flow good Complications: No apparent complications Patient did tolerate procedure well. Chest X-ray ordered to verify placement.  CXR: pending.  Olivia KussmaulKatalina Willis, AGAC-NP [place] [facility]  Pgr: [phone]  PCCM Pgr: [phone]

## 2016-05-21 NOTE — Progress Notes (Signed)
Pharmacy Antibiotic Note  Olivia Willis is a 56 y.o. female admitted on 2016/10/24 with pneumonia.  Pharmacy has been consulted for vancomycin and zosyn dosing.  [facility], downtime of approximately 45-2550min, ROSC returned after 1 epi. Possible aspiration, noted sob yesterday, [facility]. Will add zosyn.  Plan: [facility] 1500mg  [facility], will add 1g q12 hours Zosyn 3.375g IV q8 hours give first dose now Follow up culture data  Height: 5\' 8"  (172.7 cm) Weight: 180 lb (81.6 kg) IBW/kg (Calculated) : 63.9  Temp (24hrs), Avg:94.4 F (34.7 C), Min:94.3 F (34.6 C), Max:94.5 F (34.7 C)   Recent Labs Lab 2016/11/18 0645 2016/11/18 0649 2016/11/18 0654  WBC 57.2*  --   --   CREATININE 1.38*  --  1.10*  LATICACIDVEN  --  11.01*  --     Estimated Creatinine Clearance: 62.5 mL/min (A) (by C-G formula based on SCr of 1.1 mg/dL (H)).    No Known Allergies  Thank you for allowing pharmacy to be a part of this patient's care.  Sheppard CoilFrank Wilson PharmD., BCPS Clinical Pharmacist Pager [phone] 2016/10/24 10:47 AM

## 2016-05-21 NOTE — Progress Notes (Signed)
EEG Completed; Results Pending  

## 2016-05-21 NOTE — ED Provider Notes (Addendum)
[facility]-EMERGENCY DEPT Provider Note   CSN: [id number] Arrival date & time: 2016/06/16  1191   LEVEL 5 CAVEAT - UNRESPONSIVE  History   Chief Complaint Chief Complaint  Patient presents with  . post cpr    HPI Olivia Willis is a 56 y.o. female.  HPI  56 year old female presents after cardiac arrest. Initial history by EMS. Collapsed at home after CP/shortness of breath. Husband started CPR after call to 911. EMS/fire continued. No shockable rhythms. Came back after 1 epinephrine. Total CPR time estimated to be 50 min per EMS. Only medical history is asthma. Has been hypotensive in 60s per EMS. IO and IV placed by EMS.   Husband arrived and notes she has infrequent asthma, estimates last exacerbation was 10 years ago. Smokes but has recently cut back over last month. Has had cough/dyspnea x 1 week. Seen at urgent care, treated for "sinus infection" with prednisone, albuterol inhaler, and another medicine he thinks might have been an antibiotic. He said this AM she acutely complained of CP/sob on the way back from the bathroom and collapsed. She was turning blue and he started CPR after talking to 911.   Past Medical History:  Diagnosis Date  . Asthma     Patient Active Problem List   Diagnosis Date Noted  . Cardiac arrest (HCC) 2016/06/16    History reviewed. No pertinent surgical history.  OB History    No data available       Home Medications    Prior to Admission medications   Not on File    Family History History reviewed. No pertinent family history.  Social History Social History  Substance Use Topics  . Smoking status: Not on file  . Smokeless tobacco: Not on file  . Alcohol use Not on file     Allergies   Patient has no allergy information on record.   Review of Systems Review of Systems  Unable to perform ROS: Patient unresponsive     Physical Exam Updated Vital Signs BP 105/65   Pulse (!) 112   Temp (!) 94.3 F (34.6 C) (Other (Comment))  Comment (Src): temp foley  Resp (!) 23   Ht 5\' 8"  (1.727 m)   Wt 180 lb (81.6 kg)   SpO2 (!) 86%   BMI 27.37 kg/m   Physical Exam  Constitutional: She appears well-developed and well-nourished.  HENT:  Head: Normocephalic and atraumatic.  Right Ear: External ear normal.  Left Ear: External ear normal.  Nose: Nose normal.  Secretions in airway  Eyes: Right eye exhibits no discharge. Left eye exhibits no discharge.  Miotic pupils.  Cardiovascular: Regular rhythm and normal heart sounds.  Tachycardia present.   Pulses:      Radial pulses are 2+ on the right side, and 2+ on the left side.       Dorsalis pedis pulses are 2+ on the right side, and 2+ on the left side.  Pulmonary/Chest:  Breathing on her own while being ventilated by [facility] airway  Abdominal: She exhibits distension.  Musculoskeletal:  IO in place in left tibia. L calf is swollen. - EMS reports it was not this way until after IO  Neurological: She is unresponsive. GCS eye subscore is 1. GCS verbal subscore is 1. GCS motor subscore is 1.  Skin: Skin is warm and dry.  Nursing note and vitals reviewed.    ED Treatments / Results  Labs (all labs ordered are listed, but only abnormal results are displayed) Labs  Reviewed  COMPREHENSIVE METABOLIC PANEL - Abnormal; Notable for the following:       Result Value   CO2 17 (*)    Glucose, Bld 373 (*)    Creatinine, Ser 1.38 (*)    Calcium 8.7 (*)    AST 184 (*)    ALT 85 (*)    GFR calc non Af Amer 41 (*)    GFR calc Af Amer 48 (*)    Anion gap 18 (*)    All other components within normal limits  TROPONIN I - Abnormal; Notable for the following:    Troponin I 0.05 (*)    All other components within normal limits  BRAIN NATRIURETIC PEPTIDE - Abnormal; Notable for the following:    B Natriuretic Peptide 370.0 (*)    All other components within normal limits  CBC WITH DIFFERENTIAL/PLATELET - Abnormal; Notable for the following:    WBC 57.2 (*)    HCT 47.3 (*)     Platelets 403 (*)    Neutro Abs 45.2 (*)    Lymphs Abs 8.6 (*)    Monocytes Absolute 1.1 (*)    Eosinophils Absolute 1.7 (*)    Basophils Absolute 0.6 (*)    All other components within normal limits  PROTIME-INR - Abnormal; Notable for the following:    Prothrombin Time 15.3 (*)    All other components within normal limits  I-STAT CHEM 8, ED - Abnormal; Notable for the following:    Creatinine, Ser 1.10 (*)    Glucose, Bld 374 (*)    Calcium, Ion 1.08 (*)    Hemoglobin 17.0 (*)    HCT 50.0 (*)    All other components within normal limits  I-STAT CG4 LACTIC ACID, ED - Abnormal; Notable for the following:    Lactic Acid, Venous 11.01 (*)    All other components within normal limits  CULTURE, BLOOD (ROUTINE X 2)  CULTURE, BLOOD (ROUTINE X 2)  URINE CULTURE  ETHANOL  BLOOD GAS, ARTERIAL  URINALYSIS, ROUTINE W REFLEX MICROSCOPIC  PATHOLOGIST SMEAR REVIEW  I-STAT TROPOININ, ED  TYPE AND SCREEN    EKG  EKG Interpretation  Date/Time:  Thursday May 21 2016 06:44:29 EDT Ventricular Rate:  126 PR Interval:    QRS Duration: 154 QT Interval:  369 QTC Calculation: 535 R Axis:   77 Text Interpretation:  Sinus tachycardia IVCD, consider atypical LBBB No old tracing to compare Confirmed by Pricilla LovelessGoldston, Ira Busbin [phone]([id number]) on 06/01/2016 6:57:37 AM       Radiology Dg Chest Portable 1 View  Result Date: 05/12/2016 CLINICAL DATA:  CPR. EXAM: PORTABLE CHEST 1 VIEW COMPARISON:  05/15/2016 FINDINGS: Endotracheal tube tip is at the clavicular heads. An orogastric tube reaches the stomach. Normal heart size. Diffuse airspace and interstitial opacity with the appearance of pulmonary edema. No visible effusion or pneumothorax. No detected rib fracture. Limited exam with overlapping hardware and underpenetration. IMPRESSION: 1. Unremarkable positioning of endotracheal and orogastric tubes. 2. Pulmonary edema. Opacification is asymmetric to the right and superimposed pneumonia or aspiration is not  excluded. Electronically Signed   By: Marnee SpringJonathon  Watts M.D.   On: 06/11/2016 07:21    Procedures Procedure Name: Intubation Date/Time: 05/12/2016 8:00 AM Performed by: Pricilla LovelessGOLDSTON, Andrea Ferrer Pre-anesthesia Checklist: Patient identified, Suction available, Emergency Drugs available and Patient being monitored Oxygen Delivery Method: Ambu bag Preoxygenation: Pre-oxygenation with 100% oxygen Intubation Type: IV induction and Rapid sequence Laryngoscope Size: Glidescope and 3 Grade View: Grade I Tube size: 7.5 mm Number of attempts: 1  Placement Confirmation: ETT inserted through vocal cords under direct vision and CO2 detector Secured at: 23 cm Tube secured with: ETT holder Dental Injury: Teeth and Oropharynx as per pre-operative assessment       (including critical care time)  CRITICAL CARE Performed by: Pricilla Loveless T   Total critical care time: 40 minutes  Critical care time was exclusive of separately billable procedures and treating other patients.  Critical care was necessary to treat or prevent imminent or life-threatening deterioration.  Critical care was time spent personally by me on the following activities: development of treatment plan with patient and/or surrogate as well as nursing, discussions with consultants, evaluation of patient's response to treatment, examination of patient, obtaining history from patient or surrogate, ordering and performing treatments and interventions, ordering and review of laboratory studies, ordering and review of radiographic studies, pulse oximetry and re-evaluation of patient's condition.   Medications Ordered in ED Medications  sodium chloride 0.9 % bolus 1,000 mL (1,000 mLs Intravenous New Bag/Given 05/25/2016 0741)    And  sodium chloride 0.9 % bolus 1,000 mL (not administered)    And  sodium chloride 0.9 % bolus 500 mL (not administered)  cefTRIAXone (ROCEPHIN) 1 g in dextrose 5 % 50 mL IVPB (1 g Intravenous New Bag/Given May 25, 2016  0732)  azithromycin (ZITHROMAX) 500 mg in dextrose 5 % 250 mL IVPB (500 mg Intravenous New Bag/Given May 25, 2016 0734)  vancomycin (VANCOCIN) 1,500 mg in sodium chloride 0.9 % 500 mL IVPB (not administered)  albuterol (PROVENTIL) (2.5 MG/3ML) 0.083% nebulizer solution (not administered)  rocuronium (ZEMURON) injection 90 mg (90 mg Intravenous Given May 25, 2016 0650)  etomidate (AMIDATE) injection 20 mg (20 mg Intravenous Given May 25, 2016 0650)  sodium chloride 0.9 % bolus 1,000 mL (1,000 mLs Intravenous New Bag/Given May 25, 2016 0712)     Initial Impression / Assessment and Plan / ED Course  I have reviewed the triage vital signs and the nursing notes.  Pertinent labs & imaging results that were available during my care of the patient were reviewed by me and considered in my medical decision making (see chart for details).     Discussed with Dr. Jamison Neighbor. Recommend albuterol treatment and further vent setting changes. She is easily bagged and when this is done her O2 comes up to 100%. Her x-ray shows what is likely a pneumonia and possibly aspiration as well. She was treated broadly with antibiotics for community-acquired pneumonia. EKG without STEMI. She was given fluids per sepsis protocol. Given the sudden onset of worsening symptoms a CT will be obtained of her head and her chest to help rule out PE. Dr. Jamison Neighbor accepts her to go to [facility] cone ICU for further management. Her husband was updated at the bedside. She is currently in critical condition. No hypotension. Care to Dr. Particia Nearing while awaiting transport to [facility].  Final Clinical Impressions(s) / ED Diagnoses   Final diagnoses:  Cardiac arrest (HCC)  Acute respiratory failure, unspecified whether with hypoxia or hypercapnia (HCC)  Community acquired pneumonia of right lung, unspecified part of lung  Lactic acidosis    New Prescriptions New Prescriptions   No medications on file     Pricilla Loveless, MD 2016/05/25 4098    Pricilla Loveless,  MD 25-May-2016 [phone]

## 2016-05-21 NOTE — ED Notes (Signed)
Per Dr. Jamison NeighborNestor: Increase respirations of ventilator to 32 and [facility]

## 2016-05-22 ENCOUNTER — Encounter (HOSPITAL_COMMUNITY): Payer: Self-pay

## 2016-05-22 ENCOUNTER — Inpatient Hospital Stay (HOSPITAL_COMMUNITY): Payer: BLUE CROSS/BLUE SHIELD

## 2016-05-22 ENCOUNTER — Other Ambulatory Visit (HOSPITAL_COMMUNITY): Payer: BLUE CROSS/BLUE SHIELD

## 2016-05-22 DIAGNOSIS — J189 Pneumonia, unspecified organism: Secondary | ICD-10-CM

## 2016-05-22 DIAGNOSIS — J9600 Acute respiratory failure, unspecified whether with hypoxia or hypercapnia: Secondary | ICD-10-CM

## 2016-05-22 LAB — BASIC METABOLIC PANEL
ANION GAP: 10 (ref 5–15)
BUN: 12 mg/dL (ref 6–20)
CHLORIDE: 102 mmol/L (ref 101–111)
CO2: 24 mmol/L (ref 22–32)
Calcium: 7.7 mg/dL — ABNORMAL LOW (ref 8.9–10.3)
Creatinine, Ser: 0.94 mg/dL (ref 0.44–1.00)
GFR calc non Af Amer: >60 mL/min (ref >60)
GLUCOSE: 189 mg/dL — AB (ref 65–99)
Potassium: 3.4 mmol/L — ABNORMAL LOW (ref 3.5–5.1)
Sodium: 136 mmol/L (ref 135–145)

## 2016-05-22 LAB — LEGIONELLA PNEUMOPHILA SEROGP 1 UR AG: L. pneumophila Serogp 1 Ur Ag: NEGATIVE

## 2016-05-22 LAB — PATHOLOGIST SMEAR REVIEW

## 2016-05-22 LAB — POCT I-STAT, CHEM 8
BUN: 16 mg/dL (ref 6–20)
CHLORIDE: 99 mmol/L — AB (ref 101–111)
Calcium, Ion: 1.02 mmol/L — ABNORMAL LOW (ref 1.15–1.40)
Creatinine, Ser: 0.7 mg/dL (ref 0.44–1.00)
Glucose, Bld: 185 mg/dL — ABNORMAL HIGH (ref 65–99)
HCT: 39 % (ref 36.0–46.0)
Hemoglobin: 13.3 g/dL (ref 12.0–15.0)
POTASSIUM: 3.5 mmol/L (ref 3.5–5.1)
SODIUM: 139 mmol/L (ref 135–145)
TCO2: 25 mmol/L (ref 0–100)

## 2016-05-22 LAB — MAGNESIUM: Magnesium: 2.2 mg/dL (ref 1.7–2.4)

## 2016-05-22 LAB — PHOSPHORUS: PHOSPHORUS: 2.2 mg/dL — AB (ref 2.5–4.6)

## 2016-05-22 LAB — CALCIUM, IONIZED: Calcium, Ionized, Serum: 4 mg/dL — ABNORMAL LOW (ref 4.5–5.6)

## 2016-05-22 LAB — HIV ANTIBODY (ROUTINE TESTING W REFLEX): HIV Screen 4th Generation wRfx: NONREACTIVE

## 2016-05-22 MED ORDER — POTASSIUM CHLORIDE 10 MEQ/50ML IV SOLN
INTRAVENOUS | Status: AC
  Filled 2016-05-22: qty 50

## 2016-05-22 MED ORDER — MAGNESIUM SULFATE 2 GM/50ML IV SOLN
INTRAVENOUS | Status: AC
  Filled 2016-05-22: qty 50

## 2016-05-22 MED ORDER — MAGNESIUM SULFATE 2 GM/50ML IV SOLN
2.0000 g | Freq: Once | INTRAVENOUS | Status: AC
  Administered 2016-05-22: 2 g via INTRAVENOUS

## 2016-05-23 LAB — CULTURE, RESPIRATORY W GRAM STAIN: Culture: NORMAL

## 2016-05-23 LAB — CULTURE, RESPIRATORY

## 2016-05-23 LAB — URINE CULTURE: Culture: NO GROWTH

## 2016-05-24 NOTE — Procedures (Signed)
History: [age] year female status post cardiac arrest  Sedation: None   Technique: This is a 21 channel routine scalp EEG performed at the bedside with bipolar and monopolar montages arranged in accordance to the international 10/20 system of electrode placement. One channel was dedicated to EKG recording.    Background: The background is flat with the exception of occasional bursts of high-voltage activity associated with clinical myoclonus.  Photic stimulation: Physiologic driving is now performed  EEG Abnormalities: 1) burst suppression EEG 2) cortical generalized myoclonus  Clinical Interpretation: This EEG is consistent with a profound cerebral dysfunction. In this setting after cardiac arrest, this clinical-electrographic correlate is associated with dismal prognosis.  Ritta SlotMcNeill Joneric Streight, MD [facility] [phone]  If 7pm- 7am, please page neurology on call as listed in AMION.

## 2016-05-26 LAB — CULTURE, BLOOD (ROUTINE X 2)
CULTURE: NO GROWTH
Culture: NO GROWTH
Special Requests: ADEQUATE
Special Requests: ADEQUATE

## 2016-05-27 ENCOUNTER — Telehealth: Payer: Self-pay

## 2016-05-27 NOTE — Telephone Encounter (Signed)
On 05/27/16 I received a death certificate from [facility] (orignal). The death certificate is for burial. The patient is a patient of Doctor Dios. The death certificate will be taken to Pulmonary Unit @ [facility] tomorrow am for signature since Doctor Lucy ChrisDios is off today.  On 05/28/2016 I received the death certificate back from Doctor Dios. I got the death certificate ready and called the funeral home to let them know I mailed the death certificate to [employer] per the funeral home request.

## 2016-06-12 NOTE — Progress Notes (Signed)
Bronson CurbHannah Martin Smeal RN and Jeremy Johannara Parker RN wasted ~25cc of versed gtt in sink

## 2016-06-12 NOTE — Progress Notes (Signed)
Patient passed while on ventilator.  Ventilator cut off, per request of husband.  ETT still in place at this time.

## 2016-06-12 NOTE — Progress Notes (Signed)
RN to bedside to make husband aware pt is actively dying. Husband requested ventilator to be shut off. Dr. Christene Slatese Dios updated and gave verbal orders to turn vent off. RT called. Vent turned off with husband, RT, and RN at the bedside.

## 2016-06-12 NOTE — Progress Notes (Signed)
   06/11/2016 1000  Clinical Encounter Type  Visited With Patient and family together  Visit Type Follow-up  Referral From Chaplain  Consult/Referral To Chaplain  Stress Factors  Patient Stress Factors Loss  Family Stress Factors Loss  Pt. Death, no family present, family already left, pt. Body gone to morgue.  Chaplain Adilee Lemme A. Kennedy BuckerLunsford , MA-PC ,[phone]

## 2016-06-12 NOTE — Progress Notes (Signed)
eLink Physician-Brief Progress Note Patient Name: Olivia BruinsLaura Vaquera DOB: 08/27/1960 MRN: [id number]   Date of Service  2016/12/21  HPI/Events of Note  Pt continues to have bursts of torsade de pointe, has been shocked x 2. Prognosis here grim. Husband is on his way to the hospital. Will need to review status and discuss GOC  eICU Interventions       Intervention Category Major Interventions: Arrhythmia - evaluation and management  Tieler Cournoyer S. 2016/12/21, 3:06 AM

## 2016-06-12 NOTE — Progress Notes (Signed)
Pt observed in prolong polymorphic VT requiring x1 shock to convert back to NSR. Elink notified of event. Stat labs ordered. [facility] continue to monitor pt and [facility]

## 2016-06-12 NOTE — Progress Notes (Signed)
   08-31-16 0325  Clinical Encounter Type  Visited With Health care provider  Visit Type Patient actively dying  Spiritual Encounters  Spiritual Needs Ritual  Stress Factors  Patient Stress Factors Health changes  Paged of Pt dying and needing a [employer] priest. Husband on way. Contacted [employer] priest who is on way.

## 2016-06-12 NOTE — Progress Notes (Signed)
Pt continues to have multiple episodes of torsade de pointe, multiple shocks required. Elink aware. Pt's husband called and on the way to the hospital. Will continue to monitor pt.

## 2016-06-12 NOTE — Discharge Summary (Signed)
PULMONARY / CRITICAL CARE MEDICINE   Name: Olivia Willis MRN: [id number] DOB: Apr 05, 1960    ADMISSION DATE:  06/02/2016 CONSULTATION DATE:  05/20/2016  REFERRING MD:  Dr. Lorin Picket, EDP [facility]  CHIEF COMPLAINT:  Cardiac Arrest   HISTORY OF PRESENT ILLNESS:   55 year old smoker (husband reports recently patient attempting to cut back) female with PMH of Asthma. Presents to ED on 5/10 from home after witnessed cardiac arrest. Husband reports for the last week patient has been complaining of dyspnea and cough. She was seen recently at urgent care in which she was prescribed prednisone, albuterol, and possible antibiotic (family unsure).   5/10 patient was walking to bathroom when she began complaining of severe dyspnea and chest pain. Patient became unresponsive and husband called 911 and began CPR. EMS arrived 12 minutes later, and Coded patient for an additional 50 minutes for a total down time of 62 minutes. Upon arrival to ED patient was hypotensive (systolic 60-70) with lactic acid of 11.01 and WBC 57.2. Code Sepsis initiated. PE and Heat CT negative for acute process. CXR revealed diffuse disease and interstitial opacity greater to the right concerning for PNA vs Aspiration. ABG 7.093/70.8/77.1. Patient transferred to [facility] for further management with PCCM to admit.    PAST MEDICAL HISTORY :  She  has a past medical history of Asthma.  PAST SURGICAL HISTORY: She  has no past surgical history on file.  No Known Allergies  No current facility-administered medications on file prior to encounter.    No current outpatient prescriptions on file prior to encounter.    FAMILY HISTORY:  Her has no family status information on file.    SOCIAL HISTORY: She    REVIEW OF SYSTEMS:   Unable to review as patient is intubated and unresponsive   SUBJECTIVE:  Presented from [facility] on ventilator  VITAL SIGNS: BP (!) 124/48   Pulse 86   Temp 99.5 F (37.5 C)   Resp (!) 32   Ht 5\' 8"  (1.727 m)    Wt 81.6 kg (180 lb)   SpO2 96%   BMI 27.37 kg/m   HEMODYNAMICS:    VENTILATOR SETTINGS:    INTAKE / OUTPUT: No intake/output data recorded.  PHYSICAL EXAMINATION: General:  Adult female, no distress  Neuro:  Myoclonic seizures noted, upward gaze, pupils intact, no response to pain, -gag, -cough   HEENT:  ETT in place  Cardiovascular:  Tachy, no MRG, NI S1/S2 Lungs:  Distant/diminished breath sounds, no wheeze  Abdomen:  Obese, active bowel sounds Musculoskeletal:  No acute  Skin:  Cool, dry, intact   LABS:  BMET  Recent Labs Lab 06/05/2016 0645  05/23/2016 2008 05-25-16 0200 2016/05/25 0204  NA 138  < > 139 136 139  K 4.5  < > 2.9* 3.4* 3.5  CL 103  < > 105 102 99*  CO2 17*  --  21* 24  --   BUN 18  < > 16 12 16   CREATININE 1.38*  < > 1.02* 0.94 0.70  GLUCOSE 373*  < > 199* 189* 185*  < > = values in this interval not displayed.  Electrolytes  Recent Labs Lab 06/01/2016 0645 05/16/2016 2008 2016/05/25 0200  CALCIUM 8.7* 7.1* 7.7*  MG  --  1.6* 2.2  PHOS  --  1.9* 2.2*    CBC  Recent Labs Lab 05/12/2016 0645 06/08/2016 0654 05-25-16 0204  WBC 57.2*  --   --   HGB 14.9 17.0* 13.3  HCT 47.3*  50.0* 39.0  PLT 403*  --   --     Coag's  Recent Labs Lab 04-13-2016 0645  INR 1.20    Sepsis Markers  Recent Labs Lab 04-13-2016 0649 04-13-2016 1228 04-13-2016 1540  LATICACIDVEN 11.01* 4.0* 4.0*  PROCALCITON  --  6.71  --     ABG  Recent Labs Lab 04-13-2016 0800 04-13-2016 0915 04-13-2016 1238  PHART 7.093* 7.146* 7.211*  PCO2ART 70.8* 62.5* 50.0*  PO2ART 77.1* 72.5* 83.0    Liver Enzymes  Recent Labs Lab 04-13-2016 0645 04-13-2016 2008  AST 184* 142*  ALT 85* 123*  ALKPHOS 126 75  BILITOT 0.6 1.0  ALBUMIN 3.5 2.9*    Cardiac Enzymes  Recent Labs Lab 04-13-2016 0645 04-13-2016 1228 04-13-2016 2008  TROPONINI 0.05* 0.05* 0.05*    Glucose  Recent Labs Lab 04-13-2016 1201 04-13-2016 1532 04-13-2016 1935 04-13-2016 2342  GLUCAP 214* 151* 152* 172*     Imaging No results found.   STUDIES:  CT Head 5/10 > No acute CTA PE 5/10 > No PE, Borderline mild cardiomegaly without pleural effusion, large areas of dependent bilateral pulmonary consolidation, most likely aspiration, superimposed small bilateral pleural effusions, superimposed bilateral mosaic attenuation and apical septal thickening, nondisplaced left fifth and seventh rib fractures   CULTURES: Blood 5/10 >> Urine 5/10 >> Sputum 5/10 >> Step Pnemo 5/10 > Negative   ANTIBIOTICS: Cefepime 5/10 one dose in ED  Vancomycin 5/10 >> Zosyn 5/10 >> Azithromycin 5/10 >>  SIGNIFICANT EVENTS: 5/10 > Presents to ED > Cardiac Arrest   LINES/TUBES: ETT 5/10 >> RIJ 5/10 >>  DISCUSSION: 56 year old smoker with PMH of asthma presents to ED after cardiac arrest at home. Total downtime 62 minutes. CXR with CAP vs Aspiration.   ASSESSMENT / PLAN:  PULMONARY A: Acute Hypoxic/Hypercarbia Respiratory Failure +/-Aspiration, +/-CAP, +/-Volume Overload, +/-Viral Infection  Respiratory Acidosis  H/O Asthma  P:   Vent Support No wean in setting of Encephalopathy/AMS  Trend CXR/ABG  VAP Bundle  Solu-Medrol 60mg  q6h  CARDIOVASCULAR A:  Cardiac Arrest  -Total down time 62 minutes with PEA/Asystole with hypothermia on arrival > decision made note to activate code cool Cardiogenic vs Septic Shock  Elevated Trop > Demand Ischemia ? P:  Cardiac Monitoring  Trend Trop Levophed to maintain MAP >65  Place Aline/CVC  Maintain TEMP <99   RENAL A:   Lactic Acidosis  S/P 4L NS Acute Kidney Injury  P:   Trend Lactic Acid  Trend BMP Replace Electrolytes as needed  Bicarb 150 meq @ 150 ml/hr   GASTROINTESTINAL A:   Nutrition  Obesity P:   NPO PPI  HEMATOLOGIC A:   DVT prophylaxis    P:  Trend CBC Smear pending  Heparin SQ   INFECTIOUS A:   Septic Shock in setting of CAP +/- Viral infection vs Aspiration  Leukocytosis (WBC 57.2 on arrival) P:   Follow Culture  Data Trend WBC and Fever Curve  Trend Procal and Lactic Acid  RVP Pending  Vancomycin/Zosyn/Azithromycin   ENDOCRINE A:   Hyperglycemia    P:   Trend Glucose  SSI HA1C in AM   NEUROLOGIC A:   Acute Encephalopathy metabolic vs Anoxic  Myoclonic Seizure  P:   RASS goal: 0/-1 EEG pending  Keppra 500 mg BID  Fentanyl PRN    FAMILY  - Updates: No family at bedside   - Inter-disciplinary family meet or Palliative Care meeting due by:  05/28/2016   CC Time: 62 minutes  Jovita Kussmaul, AGAC-NP [facility]  Pgr: [phone]  PCCM Pgr: [phone]   Addendum : Pt remained critically ill while in the ICU.  She went into repeated Vfib, not responsive to shocks.  Husband made her a full DNR.  Pls see last progress note:  PULMONARY / CRITICAL CARE MEDICINE   Name: Olivia Willis MRN: [id number] DOB: 1960-04-02    ADMISSION DATE:  Jun 11, 2016 CONSULTATION DATE:  Jun 11, 2016  REFERRING MD:  Dr. Lorin Picket, EDP [facility]  CHIEF COMPLAINT:  Cardiac Arrest   HISTORY OF PRESENT ILLNESS:   56 year old smoker (husband reports recently patient attempting to cut back) female with PMH of Asthma. Presents to ED on 5/10 from home after witnessed cardiac arrest. Husband reports for the last week patient has been complaining of dyspnea and cough. She was seen recently at urgent care in which she was prescribed prednisone, albuterol, and possible antibiotic (family unsure).   5/10 patient was walking to bathroom when she began complaining of severe dyspnea and chest pain. Patient became unresponsive and husband called 911 and began CPR. EMS arrived 12 minutes later, and Coded patient for an additional 50 minutes for a total down time of 62 minutes. Upon arrival to ED patient was hypotensive (systolic 60-70) with lactic acid of 11.01 and WBC 57.2. Code Sepsis initiated. PE and Heat CT negative for acute process. CXR revealed diffuse disease and interstitial opacity greater to the  right concerning for PNA vs Aspiration. ABG 7.093/70.8/77.1. Patient transferred to [facility] for further management with PCCM to admit.    SUBJECTIVE:  Presented from [facility] on ventilator. Had recurrent vifb overnight not responsive to meds.  Husband made her DNR.  She is dying now.   VITAL SIGNS: BP (!) 124/48   Pulse 86   Temp 99.5 F (37.5 C)   Resp (!) 32   Ht 5\' 8"  (1.727 m)   Wt 81.6 kg (180 lb)   SpO2 96%   BMI 27.37 kg/m   HEMODYNAMICS:    VENTILATOR SETTINGS:    INTAKE / OUTPUT: No intake/output data recorded.  PHYSICAL EXAMINATION: General:  Adult female, no distress. moribund  Neuro:  no response to pain, -gag, -cough   HEENT:  ETT in place  Cardiovascular:  Tachy, no MRG, NI S1/S2 Lungs:  Distant/diminished breath sounds, no wheeze  Abdomen:  Obese, active bowel sounds Musculoskeletal:  No acute  Skin:  Cool, dry, intact   LABS:  BMET  Recent Labs Lab Jun 11, 2016 0645  06/11/2016 2008 06/07/2016 0200 06/07/2016 0204  NA 138  < > 139 136 139  K 4.5  < > 2.9* 3.4* 3.5  CL 103  < > 105 102 99*  CO2 17*  --  21* 24  --   BUN 18  < > 16 12 16   CREATININE 1.38*  < > 1.02* 0.94 0.70  GLUCOSE 373*  < > 199* 189* 185*  < > = values in this interval not displayed.  Electrolytes  Recent Labs Lab 11-Jun-2016 0645 06/11/16 2008 05/12/2016 0200  CALCIUM 8.7* 7.1* 7.7*  MG  --  1.6* 2.2  PHOS  --  1.9* 2.2*    CBC  Recent Labs Lab 11-Jun-2016 0645 11-Jun-2016 0654 05/27/2016 0204  WBC 57.2*  --   --   HGB 14.9 17.0* 13.3  HCT 47.3* 50.0* 39.0  PLT 403*  --   --     Coag's  Recent Labs Lab 06/11/16 0645  INR 1.20    Sepsis  Markers  Recent Labs Lab 05-29-16 0649 May 29, 2016 1228 05/29/16 1540  LATICACIDVEN 11.01* 4.0* 4.0*  PROCALCITON  --  6.71  --     ABG  Recent Labs Lab 2016/05/29 0800 2016-05-29 0915 05-29-16 1238  PHART 7.093* 7.146* 7.211*  PCO2ART 70.8* 62.5* 50.0*  PO2ART 77.1* 72.5* 83.0    Liver Enzymes  Recent Labs Lab  May 29, 2016 0645 29-May-2016 2008  AST 184* 142*  ALT 85* 123*  ALKPHOS 126 75  BILITOT 0.6 1.0  ALBUMIN 3.5 2.9*    Cardiac Enzymes  Recent Labs Lab 05-29-16 0645 May 29, 2016 1228 2016-05-29 2008  TROPONINI 0.05* 0.05* 0.05*    Glucose  Recent Labs Lab 05/29/2016 1201 2016/05/29 1532 2016-05-29 1935 2016/05/29 2342  GLUCAP 214* 151* 152* 172*    Imaging    STUDIES:  CT Head 5/10 > No acute CTA PE 5/10 > No PE, Borderline mild cardiomegaly without pleural effusion, large areas of dependent bilateral pulmonary consolidation, most likely aspiration, superimposed small bilateral pleural effusions, superimposed bilateral mosaic attenuation and apical septal thickening, nondisplaced left fifth and seventh rib fractures   CULTURES: Blood 5/10 >> Urine 5/10 >> Sputum 5/10 >> Step Pnemo 5/10 > Negative   ANTIBIOTICS: Cefepime 5/10 one dose in ED  Vancomycin 5/10 >> Zosyn 5/10 >> Azithromycin 5/10 >>  SIGNIFICANT EVENTS: 5/10 > Presents to ED > Cardiac Arrest   LINES/TUBES: ETT 5/10 >> RIJ 5/10 >>  DISCUSSION: 56 year old smoker with PMH of asthma presents to ED after cardiac arrest at home. Total downtime 62 minutes. CXR with CAP vs Aspiration.   ASSESSMENT / PLAN:  PULMONARY A: Acute Hypoxic/Hypercarbia Respiratory Failure +/-Aspiration, +/-CAP, +/-Volume Overload, +/-Viral Infection  Respiratory Acidosis  H/O Asthma  P:   Vent Support No wean in setting of Encephalopathy/AMS  Trend CXR/ABG  VAP Bundle  Solu-Medrol 60mg  q6h  CARDIOVASCULAR A:  Cardiac Arrest  -Total down time 62 minutes with PEA/Asystole with hypothermia on arrival > decision made note to activate code cool Cardiogenic vs Septic Shock  Elevated Trop > Demand Ischemia ? P:  Cardiac Monitoring  Levophed to maintain MAP >65  Place Aline/CVC  Maintain TEMP <99   RENAL A:   Lactic Acidosis  S/P 4L NS Acute Kidney Injury  P:   Trend Lactic Acid  Trend BMP Replace Electrolytes as needed   Bicarb 150 meq @ 150 ml/hr   GASTROINTESTINAL A:   Nutrition  Obesity P:   NPO PPI  HEMATOLOGIC A:   DVT prophylaxis    P:  Trend CBC Smear pending  Heparin SQ   INFECTIOUS A:   Septic Shock in setting of CAP +/- Viral infection vs Aspiration  Leukocytosis (WBC 57.2 on arrival) P:   Follow Culture Data Trend WBC and Fever Curve  Trend Procal and Lactic Acid  RVP Pending  Vancomycin/Zosyn/Azithromycin   ENDOCRINE A:   Hyperglycemia    P:   Trend Glucose  SSI HA1C in AM   NEUROLOGIC A:   Acute Encephalopathy metabolic vs Anoxic  Myoclonic Seizure  P:   RASS goal: 0/-1 EEG pending  Keppra 500 mg BID  Fentanyl PRN    FAMILY  - Updates: Husband at bedside.  I updated him of over all grim prognosis. Pt is actively dying now.  DNR. No escalation of care.   - Inter-disciplinary family meet or Palliative Care meeting due by:  05/28/2016     J. Alexis Frock, MD 05/26/2016, 2:14 AM  [facility] Pager [phone]  [phone] After 3 pm or if no answer, call [phone]    Addendum : pt was pronounced dead at 8:18 am, 06-03-16.  Husband at bedside.    Pollie Meyer, MD 05/26/2016, 2:16 AM [place] [facility] Pager [phone] After 3 pm or if no answer, call [phone]

## 2016-06-12 NOTE — Progress Notes (Signed)
eLink Physician-Brief Progress Note Patient Name: Olivia BruinsLaura Willis DOB: 10/06/1960 MRN: [id number]   Date of Service  June 16, 2016  HPI/Events of Note  Prolonged run of polymorphic VT. Note k and mg replaced earlier  eICU Interventions  STAT labs, replete further if indicated.      Intervention Category Major Interventions: Arrhythmia - evaluation and management  Julies Carmickle S. June 16, 2016, 2:00 AM

## 2016-06-12 NOTE — Progress Notes (Signed)
PULMONARY / CRITICAL CARE MEDICINE   Name: Olivia BruinsLaura Willis MRN: [id number] DOB: 10/14/1960    ADMISSION DATE:  06/03/2016 CONSULTATION DATE:  05/28/2016  REFERRING MD:  Dr. Lorin PicketScott, EDP [facility]  CHIEF COMPLAINT:  Cardiac Arrest   HISTORY OF PRESENT ILLNESS:   56 year old smoker (husband reports recently patient attempting to cut back) female with PMH of Asthma. Presents to ED on 5/10 from home after witnessed cardiac arrest. Husband reports for the last week patient has been complaining of dyspnea and cough. She was seen recently at urgent care in which she was prescribed prednisone, albuterol, and possible antibiotic (family unsure).   5/10 patient was walking to bathroom when she began complaining of severe dyspnea and chest pain. Patient became unresponsive and husband called 911 and began CPR. EMS arrived 12 minutes later, and Coded patient for an additional 50 minutes for a total down time of 62 minutes. Upon arrival to ED patient was hypotensive (systolic 60-70) with lactic acid of 11.01 and WBC 57.2. Code Sepsis initiated. PE and Heat CT negative for acute process. CXR revealed diffuse disease and interstitial opacity greater to the right concerning for PNA vs Aspiration. ABG 7.093/70.8/77.1. [facility] with PCCM to admit.    SUBJECTIVE:  Presented from [facility] on ventilator. Had recurrent vifb overnight not responsive to meds.  Husband made her DNR.  She is dying now.   VITAL SIGNS: BP (!) 124/48   Pulse 86   Temp 99.5 F (37.5 C)   Resp (!) 32   Ht 5\' 8"  (1.727 m)   Wt 81.6 kg (180 lb)   SpO2 96%   BMI 27.37 kg/m   HEMODYNAMICS: CVP:  [9 mmHg] 9 mmHg  VENTILATOR SETTINGS: Vent Mode: PRVC FiO2 (%):  [50 %-100 %] 50 % Set Rate:  [30 bmp-32 bmp] 32 bmp Vt Set:  [450 mL-520 mL] 520 mL PEEP:  [10 cmH20-12 cmH20] 12 cmH20 Plateau Pressure:  [26 cmH20-32 cmH20] 26 cmH20  INTAKE / OUTPUT: I/O last 3 completed shifts: In: 6618.9  [I.V.:3107.2; Other:1500; IV Piggyback:2011.7] Out: 2600 [Urine:2600]  PHYSICAL EXAMINATION: General:  Adult female, no distress. moribund  Neuro:  no response to pain, -gag, -cough   HEENT:  ETT in place  Cardiovascular:  Tachy, no MRG, NI S1/S2 Lungs:  Distant/diminished breath sounds, no wheeze  Abdomen:  Obese, active bowel sounds Musculoskeletal:  No acute  Skin:  Cool, dry, intact   LABS:  BMET  Recent Labs Lab 06/09/2016 0645  05/28/2016 2008 08-31-2016 0200 08-31-2016 0204  NA 138  < > 139 136 139  K 4.5  < > 2.9* 3.4* 3.5  CL 103  < > 105 102 99*  CO2 17*  --  21* 24  --   BUN 18  < > 16 12 16   CREATININE 1.38*  < > 1.02* 0.94 0.70  GLUCOSE 373*  < > 199* 189* 185*  < > = values in this interval not displayed.  Electrolytes  Recent Labs Lab 06/05/2016 0645 06/06/2016 2008 08-31-2016 0200  CALCIUM 8.7* 7.1* 7.7*  MG  --  1.6* 2.2  PHOS  --  1.9* 2.2*    CBC  Recent Labs Lab 05/20/2016 0645 05/16/2016 0654 08-31-2016 0204  WBC 57.2*  --   --   HGB 14.9 17.0* 13.3  HCT 47.3* 50.0* 39.0  PLT 403*  --   --     Coag's  Recent Labs Lab 05/29/2016 0645  INR 1.20  Sepsis Markers  Recent Labs Lab 05/27/2016 0649 05/30/2016 1228 06/06/2016 1540  LATICACIDVEN 11.01* 4.0* 4.0*  PROCALCITON  --  6.71  --     ABG  Recent Labs Lab 06/02/2016 0800 05/20/2016 0915 05/12/2016 1238  PHART 7.093* 7.146* 7.211*  PCO2ART 70.8* 62.5* 50.0*  PO2ART 77.1* 72.5* 83.0    Liver Enzymes  Recent Labs Lab 06/06/2016 0645 05/25/2016 2008  AST 184* 142*  ALT 85* 123*  ALKPHOS 126 75  BILITOT 0.6 1.0  ALBUMIN 3.5 2.9*    Cardiac Enzymes  Recent Labs Lab 06/04/2016 0645 05/20/2016 1228 05/18/2016 2008  TROPONINI 0.05* 0.05* 0.05*    Glucose  Recent Labs Lab 06/09/2016 1201 05/15/2016 1532 05/21/2016 1935 05/21/16 2342  GLUCAP 214* 151* 152* 172*    Imaging    STUDIES:  CT Head 5/10 > No acute CTA PE 5/10 > No PE, Borderline mild cardiomegaly without pleural  effusion, large areas of dependent bilateral pulmonary consolidation, most likely aspiration, superimposed small bilateral pleural effusions, superimposed bilateral mosaic attenuation and apical septal thickening, nondisplaced left fifth and seventh rib fractures   CULTURES: Blood 5/10 >> Urine 5/10 >> Sputum 5/10 >> Step Pnemo 5/10 > Negative   ANTIBIOTICS: Cefepime 5/10 one dose in ED  Vancomycin 5/10 >> Zosyn 5/10 >> Azithromycin 5/10 >>  SIGNIFICANT EVENTS: 5/10 > Presents to ED > Cardiac Arrest   LINES/TUBES: ETT 5/10 >> RIJ 5/10 >>  DISCUSSION: 56 year old smoker with PMH of asthma presents to ED after cardiac arrest at home. Total downtime 62 minutes. CXR with CAP vs Aspiration.   ASSESSMENT / PLAN:  PULMONARY A: Acute Hypoxic/Hypercarbia Respiratory Failure +/-Aspiration, +/-CAP, +/-Volume Overload, +/-Viral Infection  Respiratory Acidosis  H/O Asthma  P:   Vent Support No wean in setting of Encephalopathy/AMS  Trend CXR/ABG  VAP Bundle  Solu-Medrol 60mg  q6h  CARDIOVASCULAR A:  Cardiac Arrest  -Total down time 62 minutes with PEA/Asystole with hypothermia on arrival > decision made note to activate code cool Cardiogenic vs Septic Shock  Elevated Trop > Demand Ischemia ? P:  Cardiac Monitoring  Levophed to maintain MAP >65  Place Aline/CVC  Maintain TEMP <99   RENAL A:   Lactic Acidosis  S/P 4L NS Acute Kidney Injury  P:   Trend Lactic Acid  Trend BMP Replace Electrolytes as needed  Bicarb 150 meq @ 150 ml/hr   GASTROINTESTINAL A:   Nutrition  Obesity P:   NPO PPI  HEMATOLOGIC A:   DVT prophylaxis    P:  Trend CBC Smear pending  Heparin SQ   INFECTIOUS A:   Septic Shock in setting of CAP +/- Viral infection vs Aspiration  Leukocytosis (WBC 57.2 on arrival) P:   Follow Culture Data Trend WBC and Fever Curve  Trend Procal and Lactic Acid  RVP Pending  Vancomycin/Zosyn/Azithromycin   ENDOCRINE A:   Hyperglycemia    P:    Trend Glucose  SSI HA1C in AM   NEUROLOGIC A:   Acute Encephalopathy metabolic vs Anoxic  Myoclonic Seizure  P:   RASS goal: 0/-1 EEG pending  Keppra 500 mg BID  Fentanyl PRN    FAMILY  - Updates: Husband at bedside.  I updated him of over all grim prognosis. Pt is actively dying now.  DNR. No escalation of care.   - Inter-disciplinary family meet or Palliative Care meeting due by:  05/28/2016     J. Alexis Frock, MD May 26, 2016, 7:51 AM [place] [facility] Pager [phone] After 3 pm or if no answer, call [phone]

## 2016-06-12 NOTE — Progress Notes (Signed)
eLink Physician-Brief Progress Note Patient Name: Olivia BruinsLaura Willis DOB: 05/09/1960 MRN: [id number]   Date of Service  05/21/2016  HPI/Events of Note  I have spoken with the patient's husband by phone. He is at bedside after being notified that the pt had been experiencing serial episodes polymorphic VT, had been shocked on several occasions to restore a perfusing rhythm. Explained that unfortunately we were unable to maintain stability with full supportive measures, that this in addition to her neurological injuries indicated to me that she would not survive. I have recommended that we defer any further cardioversions, CPR or invasive interventions. He understood and agrees. He understands that if she goes back into VT that we will not intervene. Also discussed with him that it would be appropriate for us to discuss a formal withdrawal of care should she survive the night. He understood this as well.   eICU Interventions       Intervention Category Major Interventions: End of life / care limitation discussion Intermediate Interventions: Arrhythmia - evaluation and management  BYRUM,ROBERT S. 05/16/2016, 4:26 AM

## 2016-06-12 NOTE — Progress Notes (Signed)
Bronson CurbHannah Cloteal Isaacson RN and Jeremy Johannara Parker RN listened for heart tone for one minute each. None were auscultated. Time of death [id number]. Tele strip printed. Husband at bedside. MD notified.

## 2016-06-12 DEATH — deceased

## 2018-11-22 IMAGING — CT CT ANGIO CHEST
2 of 6 series · 18 of 46 positions shown · IV contrast (Isovue)
Comparison: Portable chest 7777 hours today. Chest radiographs
05/15/2016

CLINICAL DATA: 56 y/o female with acute chest pain and then became
unresponsive. Cardiac arrest. Status post CPR.

EXAM:
CT ANGIOGRAPHY CHEST WITH CONTRAST
TECHNIQUE: Multidetector CT imaging of the chest was performed using the
standard protocol during bolus administration of intravenous
contrast. Multiplanar CT image reconstructions and MIPs were
obtained to evaluate the vascular anatomy.
CONTRAST:  80 mL Isovue 370

[Series 5: thins · axial · 0.95mm/px · z∈[-465,-173]mm · 15 of 320 slices shown]
[im 14/320  lung]
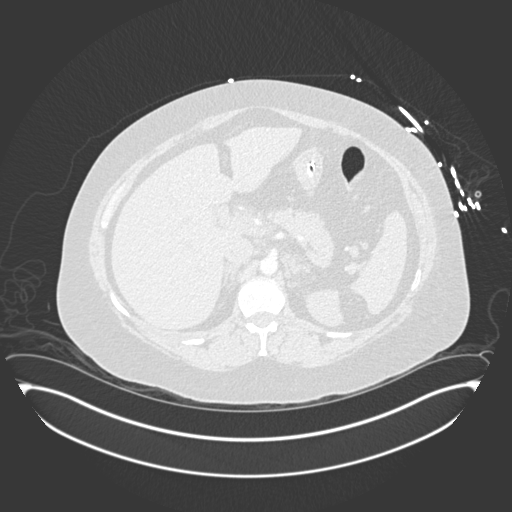
[im 42/320  soft-tissue]
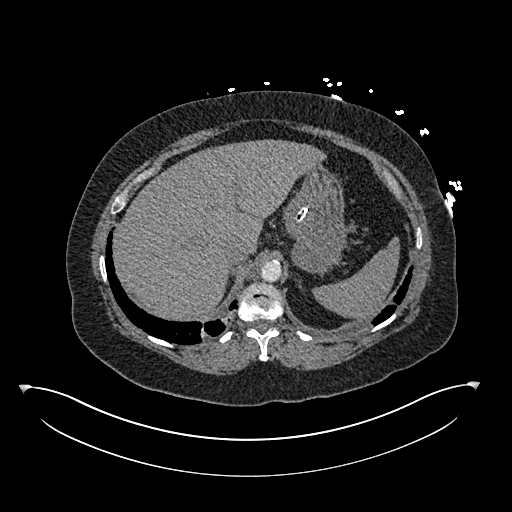
[im 56/320  lung]
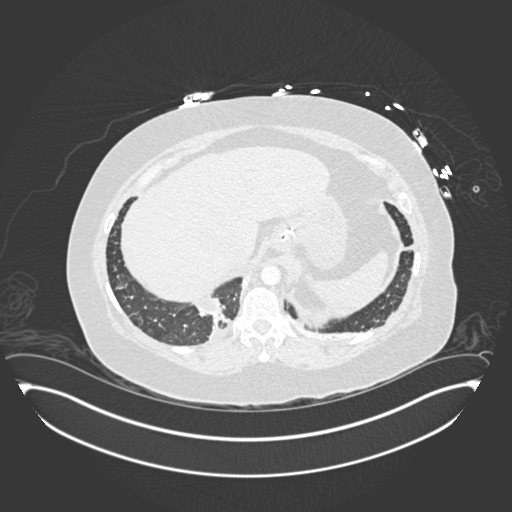
[im 84/320  soft-tissue]
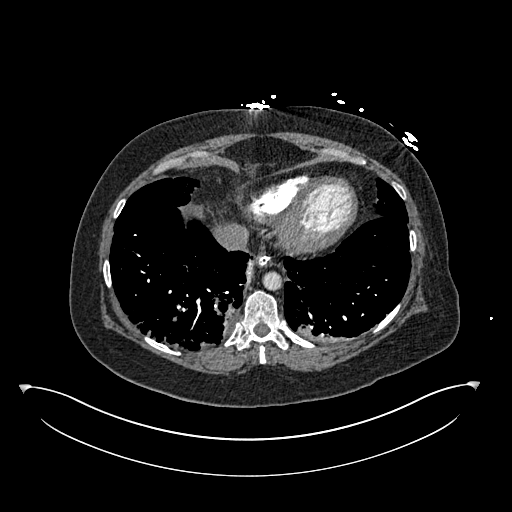
[im 98/320  lung]
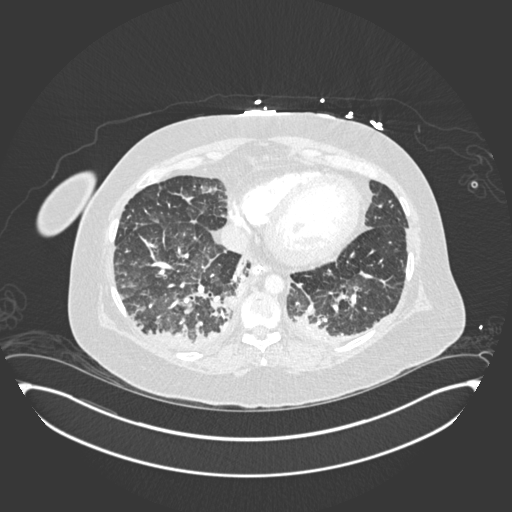
[im 125/320  soft-tissue]
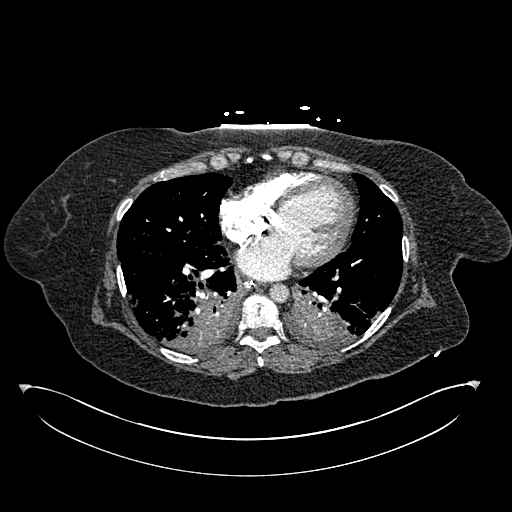
[im 139/320  lung]
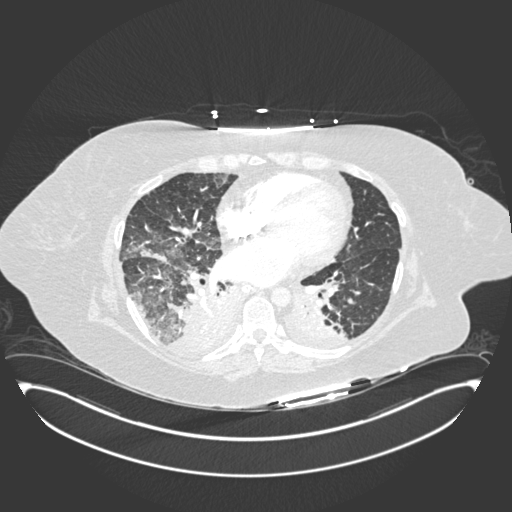
[im 167/320  soft-tissue]
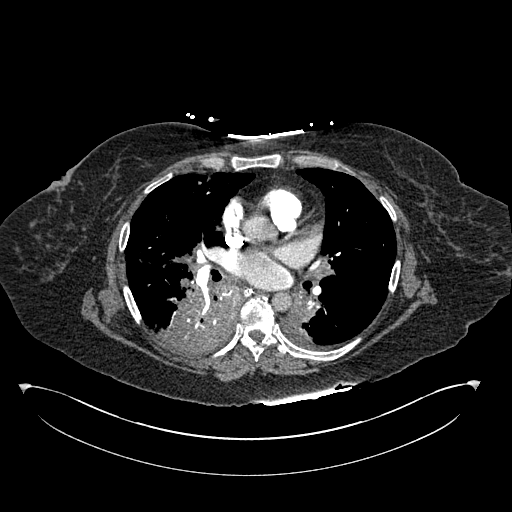
[im 181/320  lung]
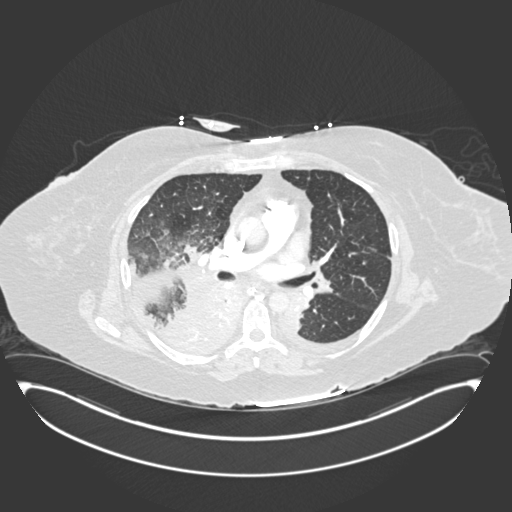
[im 195/320  soft-tissue]
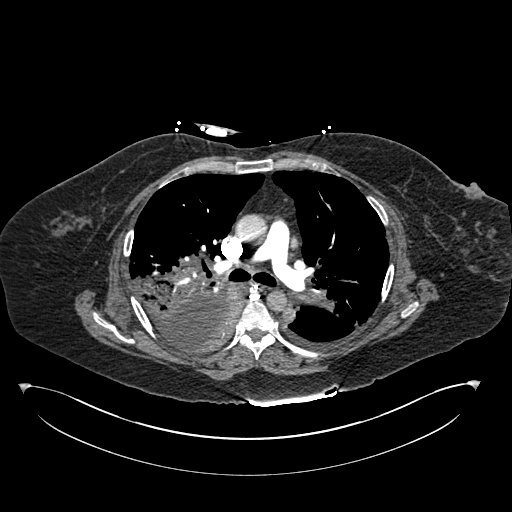
[im 222/320  lung]
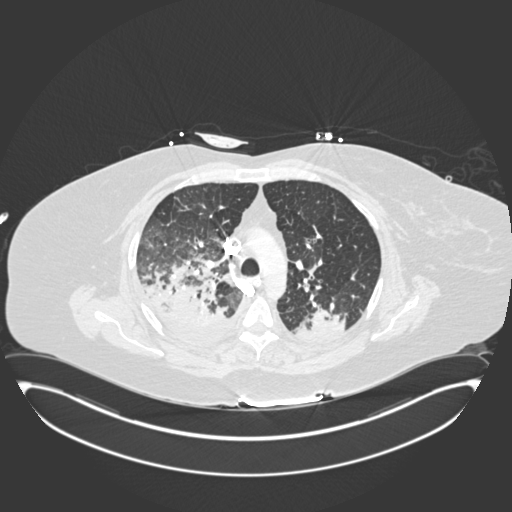
[im 236/320  soft-tissue]
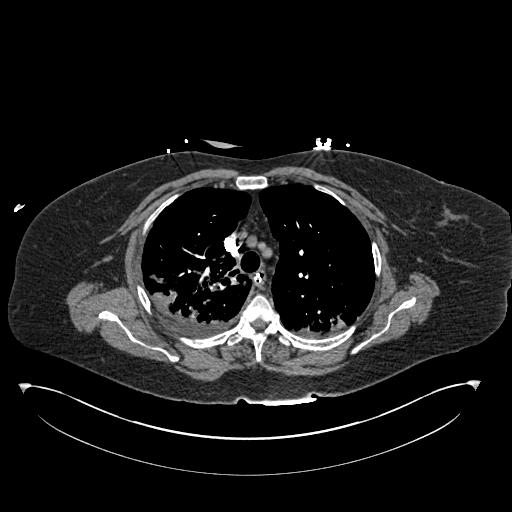
[im 264/320  lung]
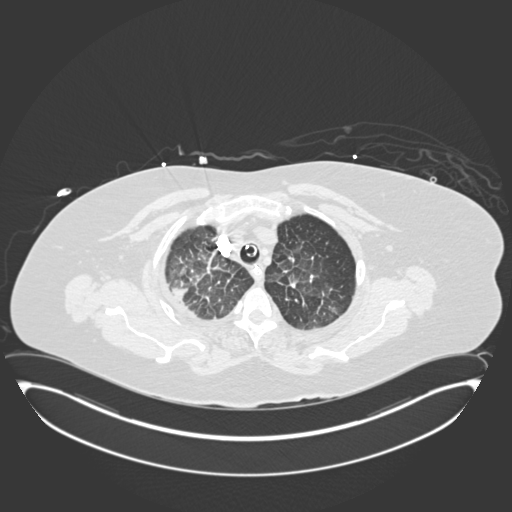
[im 278/320  soft-tissue]
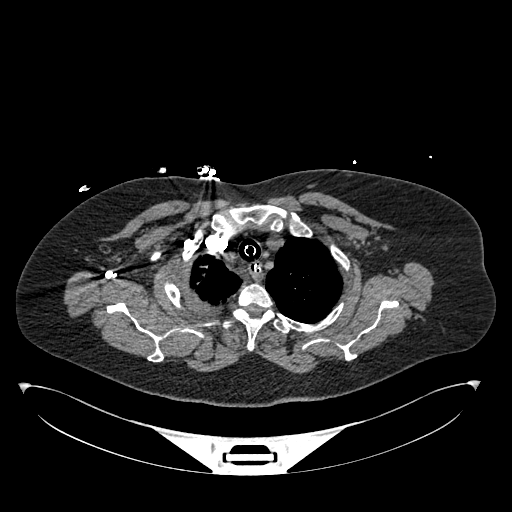
[im 306/320  lung]
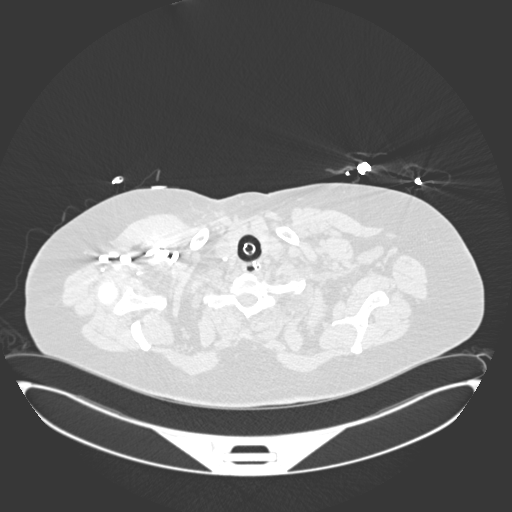

[Series 7: coronal mpr · coronal · 0.68mm/px · 3 of 151 slices shown]
[im 38/151  soft-tissue]
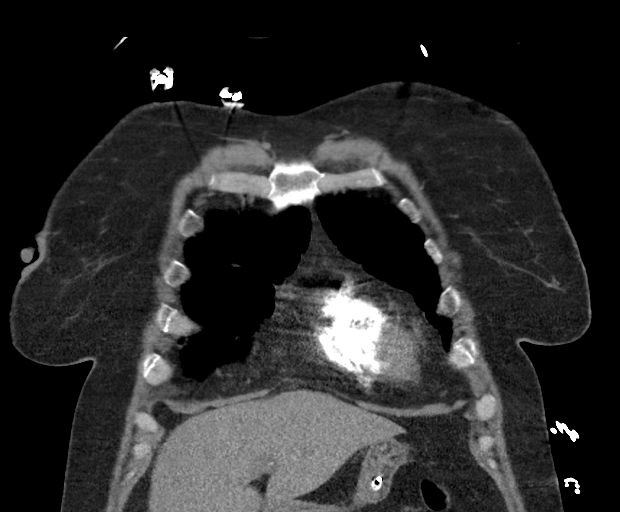
[im 76/151  soft-tissue]
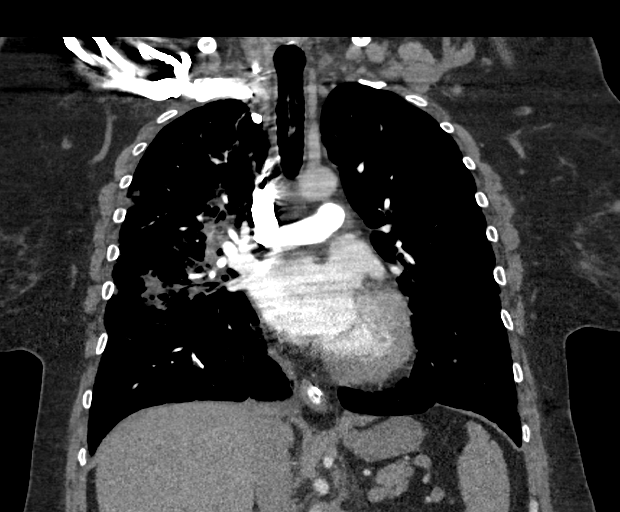
[im 113/151  soft-tissue]
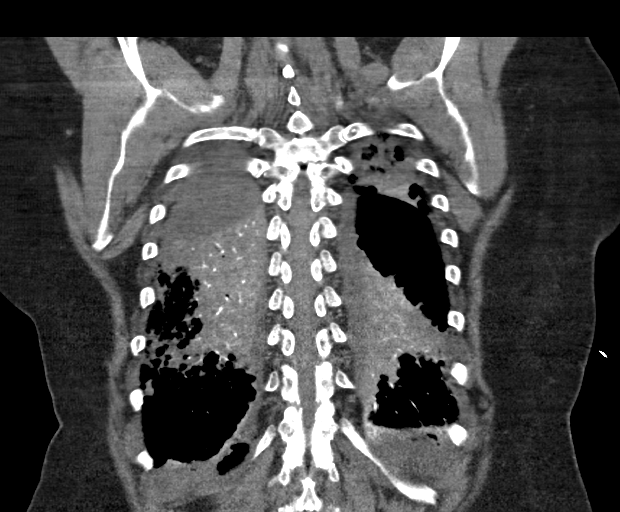

[18 of 46 positions shown; findings below may reference images not displayed]

FINDINGS: Cardiovascular: Good contrast bolus timing in the pulmonary arterial
tree.

No focal filling defect identified in the pulmonary arteries to
suggest acute pulmonary embolism.

Borderline to mild cardiomegaly. No pericardial effusion. Grossly
negative visible aorta. No calcified coronary artery atherosclerosis
is evident.

Mediastinum/Nodes: Enteric tube in place, courses to the mid
stomach. No mediastinal mass or lymphadenopathy.

Lungs/Pleura: Endotracheal tube tip located between the clavicles
and carina. Major airways are patent, but there are widespread areas
of dependent consolidation in the posterior upper lobes and right
greater than left lower lobes. Superimposed mosaic attenuation
elsewhere in the lungs. Mild upper lobe pulmonary septal thickening.
Trace superimposed bilateral pleural effusions.

Upper Abdomen: Enteric tube appears to terminated the mid stomach.
Negative visible liver, spleen, pancreas, adrenal glands and left
kidney.

Musculoskeletal: Sternum intact. Nondisplaced anterolateral left
fifth and seventh rib fractures. No other No acute osseous
abnormality identified.

Review of the MIP images confirms the above findings.
IMPRESSION: 1. No evidence of acute pulmonary embolus. Negative visible aorta.
Borderline to mild cardiomegaly without pleural effusion.
2. Fairly large areas of dependent bilateral pulmonary
consolidation, most likely aspiration in this clinical setting.
Superimposed small bilateral pleural effusions. Superimposed
bilateral mosaic attenuation and apical septal thickening may
reflect mild pulmonary edema.
3. Nondisplaced left fifth and seventh rib fractures.
